# Patient Record
Sex: Male | Born: 1955
Health system: Southern US, Community
[De-identification: ages and names within clinical notes are randomized; demographics above are authoritative.]

## PROBLEM LIST (undated history)

## (undated) DIAGNOSIS — I10 Essential (primary) hypertension: Secondary | ICD-10-CM

## (undated) DIAGNOSIS — M1990 Unspecified osteoarthritis, unspecified site: Secondary | ICD-10-CM

## (undated) DIAGNOSIS — C801 Malignant (primary) neoplasm, unspecified: Secondary | ICD-10-CM

## (undated) DIAGNOSIS — S82209A Unspecified fracture of shaft of unspecified tibia, initial encounter for closed fracture: Secondary | ICD-10-CM

## (undated) DIAGNOSIS — N529 Male erectile dysfunction, unspecified: Secondary | ICD-10-CM

## (undated) DIAGNOSIS — S2220XA Unspecified fracture of sternum, initial encounter for closed fracture: Secondary | ICD-10-CM

## (undated) DIAGNOSIS — Z972 Presence of dental prosthetic device (complete) (partial): Secondary | ICD-10-CM

## (undated) DIAGNOSIS — C4491 Basal cell carcinoma of skin, unspecified: Secondary | ICD-10-CM

## (undated) HISTORY — PX: PROSTATECTOMY: SHX69

## (undated) HISTORY — DX: Male erectile dysfunction, unspecified: N52.9

## (undated) HISTORY — DX: Basal cell carcinoma of skin, unspecified: C44.91

---

## 2006-11-05 ENCOUNTER — Emergency Department: Payer: Self-pay | Admitting: Emergency Medicine

## 2008-09-21 ENCOUNTER — Ambulatory Visit: Payer: Self-pay | Admitting: Gastroenterology

## 2011-06-12 LAB — HM COLONOSCOPY: HM Colonoscopy: NORMAL

## 2012-04-10 DIAGNOSIS — R399 Unspecified symptoms and signs involving the genitourinary system: Secondary | ICD-10-CM | POA: Insufficient documentation

## 2012-04-10 DIAGNOSIS — Z8546 Personal history of malignant neoplasm of prostate: Secondary | ICD-10-CM | POA: Insufficient documentation

## 2012-04-10 DIAGNOSIS — N529 Male erectile dysfunction, unspecified: Secondary | ICD-10-CM | POA: Insufficient documentation

## 2012-04-10 DIAGNOSIS — C61 Malignant neoplasm of prostate: Secondary | ICD-10-CM | POA: Insufficient documentation

## 2014-01-09 ENCOUNTER — Emergency Department: Payer: Self-pay | Admitting: Emergency Medicine

## 2014-01-09 LAB — CBC
HCT: 40.5 % (ref 40.0–52.0)
HGB: 13.1 g/dL (ref 13.0–18.0)
MCH: 31.1 pg (ref 26.0–34.0)
MCHC: 32.3 g/dL (ref 32.0–36.0)
MCV: 96 fL (ref 80–100)
Platelet: 327 10*3/uL (ref 150–440)
RBC: 4.21 10*6/uL — ABNORMAL LOW (ref 4.40–5.90)
RDW: 14.5 % (ref 11.5–14.5)
WBC: 8.1 10*3/uL (ref 3.8–10.6)

## 2014-01-09 LAB — COMPREHENSIVE METABOLIC PANEL
ALBUMIN: 3.8 g/dL (ref 3.4–5.0)
Alkaline Phosphatase: 38 U/L — ABNORMAL LOW
Anion Gap: 4 — ABNORMAL LOW (ref 7–16)
BUN: 14 mg/dL (ref 7–18)
Bilirubin,Total: 0.6 mg/dL (ref 0.2–1.0)
CHLORIDE: 108 mmol/L — AB (ref 98–107)
CO2: 28 mmol/L (ref 21–32)
Calcium, Total: 8.9 mg/dL (ref 8.5–10.1)
Creatinine: 1.15 mg/dL (ref 0.60–1.30)
Glucose: 87 mg/dL (ref 65–99)
Osmolality: 279 (ref 275–301)
POTASSIUM: 4.1 mmol/L (ref 3.5–5.1)
SGOT(AST): 28 U/L (ref 15–37)
SGPT (ALT): 25 U/L
Sodium: 140 mmol/L (ref 136–145)
Total Protein: 7.9 g/dL (ref 6.4–8.2)

## 2014-01-09 LAB — TROPONIN I

## 2014-01-12 DIAGNOSIS — R079 Chest pain, unspecified: Secondary | ICD-10-CM | POA: Insufficient documentation

## 2015-07-25 ENCOUNTER — Emergency Department: Payer: BLUE CROSS/BLUE SHIELD

## 2015-07-25 ENCOUNTER — Emergency Department
Admission: EM | Admit: 2015-07-25 | Discharge: 2015-07-25 | Disposition: A | Discharge status: Home / Self Care | Payer: BLUE CROSS/BLUE SHIELD | Attending: Emergency Medicine | Admitting: Emergency Medicine

## 2015-07-25 ENCOUNTER — Encounter: Payer: Self-pay | Admitting: Emergency Medicine

## 2015-07-25 DIAGNOSIS — M549 Dorsalgia, unspecified: Secondary | ICD-10-CM | POA: Insufficient documentation

## 2015-07-25 DIAGNOSIS — R109 Unspecified abdominal pain: Secondary | ICD-10-CM | POA: Diagnosis not present

## 2015-07-25 DIAGNOSIS — R079 Chest pain, unspecified: Secondary | ICD-10-CM | POA: Insufficient documentation

## 2015-07-25 LAB — URINALYSIS COMPLETE WITH MICROSCOPIC (ARMC ONLY)
BACTERIA UA: NONE SEEN
Bilirubin Urine: NEGATIVE
GLUCOSE, UA: NEGATIVE mg/dL
KETONES UR: NEGATIVE mg/dL
LEUKOCYTES UA: NEGATIVE
NITRITE: NEGATIVE
Protein, ur: NEGATIVE mg/dL
SPECIFIC GRAVITY, URINE: 1.02 (ref 1.005–1.030)
Squamous Epithelial / LPF: NONE SEEN
pH: 5 (ref 5.0–8.0)

## 2015-07-25 LAB — COMPREHENSIVE METABOLIC PANEL
ALT: 15 U/L — ABNORMAL LOW (ref 17–63)
ANION GAP: 5 (ref 5–15)
AST: 17 U/L (ref 15–41)
Albumin: 4.4 g/dL (ref 3.5–5.0)
Alkaline Phosphatase: 42 U/L (ref 38–126)
BUN: 15 mg/dL (ref 6–20)
CALCIUM: 9.5 mg/dL (ref 8.9–10.3)
CHLORIDE: 105 mmol/L (ref 101–111)
CO2: 29 mmol/L (ref 22–32)
Creatinine, Ser: 1.05 mg/dL (ref 0.61–1.24)
Glucose, Bld: 75 mg/dL (ref 65–99)
Potassium: 4.5 mmol/L (ref 3.5–5.1)
Sodium: 139 mmol/L (ref 135–145)
Total Bilirubin: 1 mg/dL (ref 0.3–1.2)
Total Protein: 7.9 g/dL (ref 6.5–8.1)

## 2015-07-25 LAB — CBC WITH DIFFERENTIAL/PLATELET
BASOS ABS: 0.1 10*3/uL (ref 0–0.1)
BASOS PCT: 1 %
EOS ABS: 0.1 10*3/uL (ref 0–0.7)
Eosinophils Relative: 2 %
HCT: 42 % (ref 40.0–52.0)
Hemoglobin: 13.9 g/dL (ref 13.0–18.0)
Lymphocytes Relative: 40 %
Lymphs Abs: 2.5 10*3/uL (ref 1.0–3.6)
MCH: 31.1 pg (ref 26.0–34.0)
MCHC: 33.1 g/dL (ref 32.0–36.0)
MCV: 93.8 fL (ref 80.0–100.0)
MONO ABS: 0.7 10*3/uL (ref 0.2–1.0)
Monocytes Relative: 11 %
NEUTROS ABS: 3 10*3/uL (ref 1.4–6.5)
Neutrophils Relative %: 46 %
PLATELETS: 342 10*3/uL (ref 150–440)
RBC: 4.48 MIL/uL (ref 4.40–5.90)
RDW: 14.7 % — ABNORMAL HIGH (ref 11.5–14.5)
WBC: 6.4 10*3/uL (ref 3.8–10.6)

## 2015-07-25 LAB — LIPASE, BLOOD: LIPASE: 21 U/L (ref 11–51)

## 2015-07-25 LAB — FIBRIN DERIVATIVES D-DIMER (ARMC ONLY): FIBRIN DERIVATIVES D-DIMER (ARMC): 682 — AB (ref 0–499)

## 2015-07-25 LAB — TROPONIN I

## 2015-07-25 MED ORDER — IOHEXOL 350 MG/ML SOLN
100.0000 mL | Freq: Once | INTRAVENOUS | Status: AC | PRN
Start: 2015-07-25 — End: 2015-07-25
  Administered 2015-07-25: 100 mL via INTRAVENOUS

## 2015-07-25 MED ORDER — IOHEXOL 240 MG/ML SOLN
25.0000 mL | Freq: Once | INTRAMUSCULAR | Status: AC | PRN
  Administered 2015-07-25: 25 mL via ORAL

## 2015-07-25 MED ORDER — MORPHINE SULFATE (PF) 4 MG/ML IV SOLN
4.0000 mg | Freq: Once | INTRAVENOUS | Status: AC
  Administered 2015-07-25: 4 mg via INTRAVENOUS
  Filled 2015-07-25: qty 1

## 2015-07-25 MED ORDER — IOHEXOL 240 MG/ML SOLN: 25.0000 mL | Freq: Once | INTRAMUSCULAR | Status: DC | PRN

## 2015-07-25 MED ORDER — ONDANSETRON HCL 4 MG/2ML IJ SOLN
4.0000 mg | Freq: Once | INTRAMUSCULAR | Status: AC
  Administered 2015-07-25: 4 mg via INTRAVENOUS
  Filled 2015-07-25: qty 2

## 2015-07-25 NOTE — ED Notes (Signed)
Brought over by [facility] clinic with flank pain. NAD

## 2015-07-25 NOTE — Discharge Instructions (Signed)
You have been seen in the Emergency Department (ED)  today for back pain.  Your workup and exam have not shown any acute abnormalities and you are likely suffering from muscle strain or possible   Please follow up with your doctor (he may wish to use the [place] walk-in clinic if he do not have a primary doctor) as soon as possible regarding today's ED visit and your back pain.  Return to the ED for worsening back pain, fever, weakness or numbness of either leg, or if you develop either (1) an inability to urinate or have bowel movements, or (2) loss of your ability to control your bathroom functions (if you start having "accidents"), or if you develop other new symptoms that concern you.  Please also make sure you set up follow-up with a primary care doctor for reevaluation of a small "spots" or "nodules" seen on CT scan. As you are a smoker you are at risk for lung cancer, and these need follow-up within the next 6 months.   Back Pain, Adult Back pain is very common in adults.The cause of back pain is rarely dangerous and the pain often gets better over time.The cause of your back pain may not be known. Some common causes of back pain include:  Strain of the muscles or ligaments supporting the spine.  Wear and tear (degeneration) of the spinal disks.  Arthritis.  Direct injury to the back. For many people, back pain may return. Since back pain is rarely dangerous, most people can learn to manage this condition on their own. HOME CARE INSTRUCTIONS Watch your back pain for any changes. The following actions may help to lessen any discomfort you are feeling:  Remain active. It is stressful on your back to sit or stand in one place for long periods of time. Do not sit, drive, or stand in one place for more than 30 minutes at a time. Take short walks on even surfaces as soon as you are able.Try to increase the length of time you walk each day.  Exercise regularly as directed by your health  care provider. Exercise helps your back heal faster. It also helps avoid future injury by keeping your muscles strong and flexible.  Do not stay in bed.Resting more than 1-2 days can delay your recovery.  Pay attention to your body when you bend and lift. The most comfortable positions are those that put less stress on your recovering back. Always use proper lifting techniques, including:  Bending your knees.  Keeping the load close to your body.  Avoiding twisting.  Find a comfortable position to sleep. Use a firm mattress and lie on your side with your knees slightly bent. If you lie on your back, put a pillow under your knees.  Avoid feeling anxious or stressed.Stress increases muscle tension and can worsen back pain.It is important to recognize when you are anxious or stressed and learn ways to manage it, such as with exercise.  Take medicines only as directed by your health care provider. Over-the-counter medicines to reduce pain and inflammation are often the most helpful.Your health care provider may prescribe muscle relaxant drugs.These medicines help dull your pain so you can more quickly return to your normal activities and healthy exercise.  Apply ice to the injured area:  Put ice in a plastic bag.  Place a towel between your skin and the bag.  Leave the ice on for 20 minutes, 2-3 times a day for the first 2-3 days. After that,  ice and heat may be alternated to reduce pain and spasms.  Maintain a healthy weight. Excess weight puts extra stress on your back and makes it difficult to maintain good posture. SEEK MEDICAL CARE IF:  You have pain that is not relieved with rest or medicine.  You have increasing pain going down into the legs or buttocks.  You have pain that does not improve in one week.  You have night pain.  You lose weight.  You have a fever or chills. SEEK IMMEDIATE MEDICAL CARE IF:   You develop new bowel or bladder control problems.  You have  unusual weakness or numbness in your arms or legs.  You develop nausea or vomiting.  You develop abdominal pain.  You feel faint.   This information is not intended to replace advice given to you by your health care provider. Make sure you discuss any questions you have with your health care provider.   Document Released: 05/28/2005 Document Revised: 06/18/2014 Document Reviewed: 09/29/2013 Elsevier Interactive Patient Education [employer].

## 2015-07-25 NOTE — ED Provider Notes (Signed)
[facility] Emergency Department Provider Note  ____________________________________________  Time seen: Approximately 12:54 PM  I have reviewed the triage vital signs and the nursing notes.   HISTORY  Chief Complaint Flank Pain    HPI Justin Mckee is a 60 y.o. male reports no past medical history.  Patient reports that for the last week and a half he's been having pain that is a "catching" and severe pain whenever he moves twists or takes a deep breath located over the right mid back and flank versus right lower chest. Not associated nausea vomiting fevers chills, and he denies "abdominal pain".  Denies any medication use other than taking a couple ibuprofen yesterday but did not make much improvement.  History reviewed. No pertinent past medical history.  There are no active problems to display for this patient.   Past Surgical History  Procedure Laterality Date  . Prostatectomy      No current outpatient prescriptions on file.  Allergies Review of patient's allergies indicates no known allergies.  History reviewed. No pertinent family history.  Social History Social History  Substance Use Topics  . Smoking status: Never Smoker   . Smokeless tobacco: None  . Alcohol Use: None    Review of Systems Constitutional: No fever/chills Eyes: No visual changes. ENT: No sore throat. Cardiovascular: Denies chest pain. Respiratory: Denies shortness of breath. Gastrointestinal: No abdominal pain describes pain along the right mid flank and right lower posterior back.  No nausea, no vomiting.  No diarrhea.  No constipation. Genitourinary: Negative for dysuria. Musculoskeletal: Negative for back pain. Skin: Negative for rash. Neurological: Negative for headaches, focal weakness or numbness.  10-point ROS otherwise negative.  Denies recent hospitalizations, history of blood clots, leg swelling. The patient is a smoker. No long trips or travel. No  cough or hemoptysis. ____________________________________________   PHYSICAL EXAM:  VITAL SIGNS: ED Triage Vitals  Enc Vitals Group     BP 07/25/15 1206 137/98 mmHg     Pulse Rate 07/25/15 1206 78     Resp 07/25/15 1206 20     Temp 07/25/15 1206 98 F (36.7 C)     Temp Source 07/25/15 1206 Oral     SpO2 07/25/15 1206 99 %     Weight 07/25/15 1206 160 lb (72.576 kg)     Height 07/25/15 1206 5\' 9"  (1.753 m)     Head Cir --      Peak Flow --      Pain Score --      Pain Loc --      Pain Edu? --      Excl. in [place]? --    Constitutional: Alert and oriented. Well appearing and in no acute distress. Eyes: Conjunctivae are normal. PERRL. EOMI. Head: Atraumatic. Nose: No congestion/rhinnorhea. Mouth/Throat: Mucous membranes are moist.  Oropharynx non-erythematous. Neck: No stridor.  No cervical tenderness Cardiovascular: Normal rate, regular rhythm. Grossly normal heart sounds.  Good peripheral circulation. Patient does have notable tenderness along the right parathoracic paraspinous muscles. Respiratory: Normal respiratory effort.  No retractions. Lungs CTAB. Patient does splint with deep inspiration states pain is worsened when he takes a deep breath. Gastrointestinal: Soft and nontender except for pain with deep inspiration and moderate tenderness to the right upper quadrant without rebound or guarding. No distention. No abdominal bruits. Moderate right-sided CVA tenderness, none on left. Musculoskeletal: No lower extremity tenderness nor edema.  No joint effusions. Neurologic:  Normal speech and language. No gross focal neurologic deficits are appreciated.  No gait instability. Skin:  Skin is warm, dry and intact. No rash noted. Psychiatric: Mood and affect are normal. Speech and behavior are normal.  ____________________________________________   LABS (all labs ordered are listed, but only abnormal results are displayed)  Labs Reviewed  CBC WITH DIFFERENTIAL/PLATELET - Abnormal;  Notable for the following:    RDW 14.7 (*)    All other components within normal limits  COMPREHENSIVE METABOLIC PANEL - Abnormal; Notable for the following:    ALT 15 (*)    All other components within normal limits  URINALYSIS COMPLETEWITH MICROSCOPIC ([facility] ONLY) - Abnormal; Notable for the following:    Color, Urine YELLOW (*)    APPearance CLEAR (*)    Hgb urine dipstick 1+ (*)    All other components within normal limits  FIBRIN DERIVATIVES D-DIMER ([facility] ONLY) - Abnormal; Notable for the following:    Fibrin derivatives D-dimer ([facility]) 682 (*)    All other components within normal limits  LIPASE, BLOOD  TROPONIN I   ____________________________________________  EKG  Reviewed and interpreted me at 1310 Normal sinus rhythm Heart rate 60 Probable left ventricular hypertrophy No acute ischemic T-wave abnormality QTc 4:30 QRS 90 reviewed as left ventricular hypertrophy without acute ischemic abnormality noted ____________________________________________  RADIOLOGY  IMPRESSION: 1. A specific cause to explain the patient's right abdominal and back pain is not identified. 2. No filling defect is identified in the pulmonary arterial tree to suggest pulmonary embolus. 3. Mild cardiomegaly. 4. Mild wall thickening in the proximal half of the esophagus-esophagitis not excluded. 5. Paraseptal emphysema. 6. 5 mm right upper lobe nodule and 4 mm right middle lobe nodule. If the patient is at high risk for bronchogenic carcinoma, follow-up chest CT at 6-12 months is recommended. If the patient is at low risk for bronchogenic carcinoma, follow-up chest CT at 12 months is recommended. This recommendation follows the consensus statement: Guidelines for Management of Small Pulmonary Nodules Detected on CT Scans: A Statement from the [place] as published in Radiology 2005;237:395-400. 7. Borderline enlarged right infrahilar lymph node. 8. Mild abdominal aortic  atherosclerotic calcification. 9. Prior prostatectomy. ____________________________________________   PROCEDURES  Procedure(s) performed: None  Critical Care performed: No  ____________________________________________   INITIAL IMPRESSION / ASSESSMENT AND PLAN / ED COURSE  Pertinent labs & imaging results that were available during my care of the patient were reviewed by me and considered in my medical decision making (see chart for details).  Patient presents with right sided posterior midthoracic to lower thoracic back pain. Slightly unclear etiology of pain is referred from paraspinous muscles, posterior chest, or right upper quadrant. He is tender and has right-sided CVA tenderness and right upper quadrant pain. LFTs are notably normal. Lipase wnl.  Patient has no frank evidence of pulmonary embolism, dissection, an EKG without ischemic change noted and very atypical symptoms not suspicious for acute coronary syndrome at this time.  Procedure the chest x-ray, the patient is low risk but does demonstrate pleuritic component of pain that have screen via d-dimer.   ----------------------------------------- 4:21 PM on 07/25/2015 -----------------------------------------  Patient reports pain much better. Resting comfortably. No distress. CT scan reassuring. Await troponin and lipase. If both negative anticipate discharge to home, likely musculoskeletal in nature without evidence of acute dissection, pulmonary embolism, acute coronary syndrome pneumothorax or other immediate life-threatening identified. Patient appears to be stable for discharge.  Return precautions and treatment recommendations and follow-up discussed with the patient who is agreeable with the plan.  ____________________________________________  FINAL CLINICAL IMPRESSION(S) / ED DIAGNOSES  Final diagnoses:  Back pain      Delman Kitten, MD 07/25/15 2229

## 2015-07-25 NOTE — ED Notes (Signed)
Patient transported to CT 

## 2015-08-28 ENCOUNTER — Encounter: Payer: Self-pay | Admitting: *Deleted

## 2015-08-28 ENCOUNTER — Emergency Department: Payer: BLUE CROSS/BLUE SHIELD

## 2015-08-28 ENCOUNTER — Emergency Department
Admission: EM | Admit: 2015-08-28 | Discharge: 2015-08-28 | Disposition: A | Discharge status: Other Acute Inpatient Hospital | Payer: BLUE CROSS/BLUE SHIELD | Attending: Emergency Medicine | Admitting: Emergency Medicine

## 2015-08-28 DIAGNOSIS — Y998 Other external cause status: Secondary | ICD-10-CM | POA: Diagnosis not present

## 2015-08-28 DIAGNOSIS — S2222XA Fracture of body of sternum, initial encounter for closed fracture: Secondary | ICD-10-CM | POA: Diagnosis not present

## 2015-08-28 DIAGNOSIS — Y92410 Unspecified street and highway as the place of occurrence of the external cause: Secondary | ICD-10-CM | POA: Diagnosis not present

## 2015-08-28 DIAGNOSIS — Y9389 Activity, other specified: Secondary | ICD-10-CM | POA: Diagnosis not present

## 2015-08-28 DIAGNOSIS — S82251A Displaced comminuted fracture of shaft of right tibia, initial encounter for closed fracture: Secondary | ICD-10-CM | POA: Diagnosis not present

## 2015-08-28 DIAGNOSIS — R55 Syncope and collapse: Secondary | ICD-10-CM | POA: Insufficient documentation

## 2015-08-28 DIAGNOSIS — F17210 Nicotine dependence, cigarettes, uncomplicated: Secondary | ICD-10-CM | POA: Diagnosis not present

## 2015-08-28 DIAGNOSIS — S2220XA Unspecified fracture of sternum, initial encounter for closed fracture: Secondary | ICD-10-CM

## 2015-08-28 DIAGNOSIS — S82209A Unspecified fracture of shaft of unspecified tibia, initial encounter for closed fracture: Secondary | ICD-10-CM

## 2015-08-28 DIAGNOSIS — F1099 Alcohol use, unspecified with unspecified alcohol-induced disorder: Secondary | ICD-10-CM | POA: Diagnosis not present

## 2015-08-28 DIAGNOSIS — Z8546 Personal history of malignant neoplasm of prostate: Secondary | ICD-10-CM | POA: Insufficient documentation

## 2015-08-28 DIAGNOSIS — S82201A Unspecified fracture of shaft of right tibia, initial encounter for closed fracture: Secondary | ICD-10-CM

## 2015-08-28 HISTORY — DX: Unspecified fracture of sternum, initial encounter for closed fracture: S22.20XA

## 2015-08-28 HISTORY — DX: Malignant (primary) neoplasm, unspecified: C80.1

## 2015-08-28 HISTORY — DX: Unspecified fracture of shaft of unspecified tibia, initial encounter for closed fracture: S82.209A

## 2015-08-28 LAB — URINE DRUG SCREEN, QUALITATIVE (ARMC ONLY)
Amphetamines, Ur Screen: NOT DETECTED
BARBITURATES, UR SCREEN: NOT DETECTED
Benzodiazepine, Ur Scrn: NOT DETECTED
CANNABINOID 50 NG, UR ~~LOC~~: NOT DETECTED
COCAINE METABOLITE, UR ~~LOC~~: NOT DETECTED
MDMA (Ecstasy)Ur Screen: NOT DETECTED
Methadone Scn, Ur: NOT DETECTED
OPIATE, UR SCREEN: NOT DETECTED
Phencyclidine (PCP) Ur S: NOT DETECTED
Tricyclic, Ur Screen: NOT DETECTED

## 2015-08-28 LAB — HEPATIC FUNCTION PANEL
ALK PHOS: 42 U/L (ref 38–126)
ALT: 28 U/L (ref 17–63)
AST: 45 U/L — ABNORMAL HIGH (ref 15–41)
Albumin: 4.1 g/dL (ref 3.5–5.0)
BILIRUBIN DIRECT: 0.2 mg/dL (ref 0.1–0.5)
BILIRUBIN INDIRECT: 0.4 mg/dL (ref 0.3–0.9)
BILIRUBIN TOTAL: 0.6 mg/dL (ref 0.3–1.2)
Total Protein: 7.6 g/dL (ref 6.5–8.1)

## 2015-08-28 LAB — BASIC METABOLIC PANEL
Anion gap: 8 (ref 5–15)
BUN: 10 mg/dL (ref 6–20)
CALCIUM: 8.8 mg/dL — AB (ref 8.9–10.3)
CHLORIDE: 103 mmol/L (ref 101–111)
CO2: 24 mmol/L (ref 22–32)
CREATININE: 1.32 mg/dL — AB (ref 0.61–1.24)
GFR calc non Af Amer: 57 mL/min — ABNORMAL LOW (ref >60)
Glucose, Bld: 189 mg/dL — ABNORMAL HIGH (ref 65–99)
Potassium: 3.1 mmol/L — ABNORMAL LOW (ref 3.5–5.1)
SODIUM: 135 mmol/L (ref 135–145)

## 2015-08-28 LAB — CBC
HCT: 38.3 % — ABNORMAL LOW (ref 40.0–52.0)
Hemoglobin: 13.4 g/dL (ref 13.0–18.0)
MCH: 31.9 pg (ref 26.0–34.0)
MCHC: 34.9 g/dL (ref 32.0–36.0)
MCV: 91.6 fL (ref 80.0–100.0)
PLATELETS: 325 10*3/uL (ref 150–440)
RBC: 4.18 MIL/uL — AB (ref 4.40–5.90)
RDW: 14.5 % (ref 11.5–14.5)
WBC: 8.2 10*3/uL (ref 3.8–10.6)

## 2015-08-28 LAB — URINALYSIS COMPLETE WITH MICROSCOPIC (ARMC ONLY)
BILIRUBIN URINE: NEGATIVE
Bacteria, UA: NONE SEEN
GLUCOSE, UA: 50 mg/dL — AB
Ketones, ur: NEGATIVE mg/dL
LEUKOCYTES UA: NEGATIVE
NITRITE: NEGATIVE
Protein, ur: 30 mg/dL — AB
SPECIFIC GRAVITY, URINE: 1.012 (ref 1.005–1.030)
Squamous Epithelial / LPF: NONE SEEN
pH: 6 (ref 5.0–8.0)

## 2015-08-28 LAB — TROPONIN I: Troponin I: <0.03 ng/mL (ref <0.031)

## 2015-08-28 LAB — LIPASE, BLOOD: LIPASE: 23 U/L (ref 11–51)

## 2015-08-28 LAB — ETHANOL: Alcohol, Ethyl (B): 61 mg/dL — ABNORMAL HIGH (ref <5)

## 2015-08-28 MED ORDER — FENTANYL CITRATE (PF) 100 MCG/2ML IJ SOLN
50.0000 ug | Freq: Once | INTRAMUSCULAR | Status: AC
  Administered 2015-08-28: 50 ug via INTRAVENOUS
  Filled 2015-08-28: qty 2

## 2015-08-28 MED ORDER — IOHEXOL 300 MG/ML  SOLN
100.0000 mL | Freq: Once | INTRAMUSCULAR | Status: AC | PRN
  Administered 2015-08-28: 100 mL via INTRAVENOUS

## 2015-08-28 MED ORDER — SODIUM CHLORIDE 0.9 % IV BOLUS (SEPSIS)
500.0000 mL | Freq: Once | INTRAVENOUS | Status: AC
  Administered 2015-08-28: 500 mL via INTRAVENOUS

## 2015-08-28 NOTE — ED Notes (Signed)
Pt triaged as high fall risk due to presenting symptoms of syncope

## 2015-08-28 NOTE — ED Notes (Signed)
Verbal report given to [facility]

## 2015-08-28 NOTE — ED Notes (Signed)
At pt's bedside documenting after he returned from radiology; pt has nodded off to sleep after c/o right leg and chest pain; resp even and nonlabored; VSS;

## 2015-08-28 NOTE — ED Notes (Signed)
Pt back from CT and x ray
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
## 2015-08-29 DIAGNOSIS — F17200 Nicotine dependence, unspecified, uncomplicated: Secondary | ICD-10-CM | POA: Insufficient documentation

## 2015-08-29 DIAGNOSIS — Z8781 Personal history of (healed) traumatic fracture: Secondary | ICD-10-CM | POA: Insufficient documentation

## 2015-08-29 DIAGNOSIS — S2222XA Fracture of body of sternum, initial encounter for closed fracture: Secondary | ICD-10-CM | POA: Insufficient documentation

## 2015-09-18 ENCOUNTER — Encounter: Payer: Self-pay | Admitting: Emergency Medicine

## 2015-09-18 ENCOUNTER — Emergency Department
Admission: EM | Admit: 2015-09-18 | Discharge: 2015-09-18 | Disposition: A | Discharge status: Home / Self Care | Payer: BLUE CROSS/BLUE SHIELD | Attending: Emergency Medicine | Admitting: Emergency Medicine

## 2015-09-18 ENCOUNTER — Emergency Department: Payer: BLUE CROSS/BLUE SHIELD

## 2015-09-18 DIAGNOSIS — R079 Chest pain, unspecified: Secondary | ICD-10-CM | POA: Diagnosis present

## 2015-09-18 DIAGNOSIS — J189 Pneumonia, unspecified organism: Secondary | ICD-10-CM | POA: Insufficient documentation

## 2015-09-18 DIAGNOSIS — Z8546 Personal history of malignant neoplasm of prostate: Secondary | ICD-10-CM | POA: Diagnosis not present

## 2015-09-18 DIAGNOSIS — F17210 Nicotine dependence, cigarettes, uncomplicated: Secondary | ICD-10-CM | POA: Insufficient documentation

## 2015-09-18 LAB — CBC
HEMATOCRIT: 36.5 % — AB (ref 40.0–52.0)
HEMOGLOBIN: 12.3 g/dL — AB (ref 13.0–18.0)
MCH: 30.7 pg (ref 26.0–34.0)
MCHC: 33.7 g/dL (ref 32.0–36.0)
MCV: 91.1 fL (ref 80.0–100.0)
Platelets: 351 10*3/uL (ref 150–440)
RBC: 4.01 MIL/uL — AB (ref 4.40–5.90)
RDW: 13.6 % (ref 11.5–14.5)
WBC: 11.5 10*3/uL — ABNORMAL HIGH (ref 3.8–10.6)

## 2015-09-18 LAB — BASIC METABOLIC PANEL
ANION GAP: 8 (ref 5–15)
BUN: 13 mg/dL (ref 6–20)
CHLORIDE: 96 mmol/L — AB (ref 101–111)
CO2: 27 mmol/L (ref 22–32)
CREATININE: 0.95 mg/dL (ref 0.61–1.24)
Calcium: 9.6 mg/dL (ref 8.9–10.3)
GFR calc non Af Amer: >60 mL/min (ref >60)
Glucose, Bld: 105 mg/dL — ABNORMAL HIGH (ref 65–99)
POTASSIUM: 3.9 mmol/L (ref 3.5–5.1)
Sodium: 131 mmol/L — ABNORMAL LOW (ref 135–145)

## 2015-09-18 LAB — TROPONIN I: Troponin I: <0.03 ng/mL (ref <0.031)

## 2015-09-18 MED ORDER — LEVOFLOXACIN 750 MG PO TABS: 750.0000 mg | ORAL_TABLET | Freq: Every day | ORAL | Status: AC

## 2015-09-18 MED ORDER — LEVOFLOXACIN 750 MG PO TABS
750.0000 mg | ORAL_TABLET | Freq: Every day | ORAL | Status: DC
  Administered 2015-09-18: 750 mg via ORAL
  Filled 2015-09-18: qty 1

## 2015-09-18 NOTE — ED Notes (Signed)
Pt states at approx 1300 he began having chest pain that radiates to his back. Pt denies cardiac hx, states he has been seen before for chest pain.
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
## 2015-09-18 NOTE — Discharge Instructions (Signed)
Please seek medical attention for any high fevers, chest pain, shortness of breath, change in behavior, persistent vomiting, bloody stool or any other new or concerning symptoms.   Community-Acquired Pneumonia, Adult Pneumonia is an infection of the lungs. One type of pneumonia can happen while a person is in a hospital. A different type can happen when a person is not in a hospital (community-acquired pneumonia). It is easy for this kind to spread from person to person. It can spread to you if you breathe near an infected person who coughs or sneezes. Some symptoms include:  A dry cough.  A wet (productive) cough.  Fever.  Sweating.  Chest pain. HOME CARE  Take over-the-counter and prescription medicines only as told by your doctor.  Only take cough medicine if you are losing sleep.  If you were prescribed an antibiotic medicine, take it as told by your doctor. Do not stop taking the antibiotic even if you start to feel better.  Sleep with your head and neck raised (elevated). You can do this by putting a few pillows under your head, or you can sleep in a recliner.  Do not use tobacco products. These include cigarettes, chewing tobacco, and e-cigarettes. If you need help quitting, ask your doctor.  Drink enough water to keep your pee (urine) clear or pale yellow. A shot (vaccine) can help prevent pneumonia. Shots are often suggested for: 1. People older than 60 years of age. 2. People older than 60 years of age: 15. Who are having cancer treatment. 2. Who have long-term (chronic) lung disease. 3. Who have problems with their body's defense system (immune system). You may also prevent pneumonia if you take these actions:  Get the flu (influenza) shot every year.  Go to the dentist as often as told.  Wash your hands often. If soap and water are not available, use hand sanitizer. GET HELP IF:  You have a fever.  You lose sleep because your cough medicine does not help. GET  HELP RIGHT AWAY IF:  You are short of breath and it gets worse.  You have more chest pain.  Your sickness gets worse. This is very serious if:  You are an older adult.  Your body's defense system is weak.  You cough up blood.   This information is not intended to replace advice given to you by your health care provider. Make sure you discuss any questions you have with your health care provider.   Document Released: 11/14/2007 Document Revised: 02/16/2015 Document Reviewed: 09/22/2014 Elsevier Interactive Patient Education 2016 [place] An incentive spirometer is a tool that can help keep your lungs clear and active. This tool measures how well you are filling your lungs with each breath. Taking long, deep breaths may help reverse or decrease the chance of developing breathing (pulmonary) problems (especially infection) following:  Surgery of the chest or abdomen.  Surgery if you have a history of smoking or a lung problem.  A long period of time when you are unable to move or be active. BEFORE THE PROCEDURE   If the spirometer includes an indicator to show your best effort, your nurse or respiratory therapist will set it to a desired goal.  If possible, sit up straight or lean slightly forward. Try not to slouch.  Hold the incentive spirometer in an upright position. INSTRUCTIONS FOR USE  3. Sit on the edge of your bed if possible, or sit up as far as you can in bed or  on a chair. 4. Hold the incentive spirometer in an upright position. 5. Breathe out normally. 6. Place the mouthpiece in your mouth and seal your lips tightly around it. 7. Breathe in slowly and as deeply as possible, raising the piston or the ball toward the top of the column. 8. Hold your breath for 3-5 seconds or for as long as possible. Allow the piston or ball to fall to the bottom of the column. 9. Remove the mouthpiece from your mouth and breathe out normally. 10. Rest for a  few seconds and repeat Steps 1 through 7 at least 10 times every 1-2 hours when you are awake. Take your time and take a few normal breaths between deep breaths. 11. The spirometer may include an indicator to show your best effort. Use the indicator as a goal to work toward during each repetition. 12. After each set of 10 deep breaths, practice coughing to be sure your lungs are clear. If you have an incision (the cut made at the time of surgery), support your incision when coughing by placing a pillow or rolled-up towels firmly against it. Once you are able to get out of bed, walk around indoors and cough well. You may stop using the incentive spirometer when instructed by your caregiver.  RISKS AND COMPLICATIONS  Breathing too quickly may cause dizziness. At an extreme, this could cause you to pass out. Take your time so you do not get dizzy or light-headed.  If you are in pain, you may need to take or ask for pain medication before doing incentive spirometry. It is harder to take a deep breath if you are having pain. AFTER USE  Rest and breathe slowly and easily.  It can be helpful to keep a log of your progress. Your caregiver can provide you with a simple table to help with this. If you are using the spirometer at home, follow these instructions: [place] IF:   You are having difficultly using the spirometer.  You have trouble using the spirometer as often as instructed.  Your pain medication is not giving enough relief while using the spirometer.  You develop fever of 100.33F (38.1C) or higher. SEEK IMMEDIATE MEDICAL CARE IF:   You cough up bloody sputum that had not been present before.  You develop fever of 102F (38.9C) or greater.  You develop worsening pain at or near the incision site. MAKE SURE YOU:   Understand these instructions.  Will watch your condition.  Will get help right away if you are not doing well or get worse.   This information is not  intended to replace advice given to you by your health care provider. Make sure you discuss any questions you have with your health care provider.   Document Released: 10/08/2006 Document Revised: 06/18/2014 Document Reviewed: 01/04/2014 Elsevier Interactive Patient Education [employer].

## 2015-10-14 ENCOUNTER — Ambulatory Visit
Admission: RE | Admit: 2015-10-14 | Discharge: 2015-10-14 | Disposition: A | Discharge status: Home / Self Care | Payer: BLUE CROSS/BLUE SHIELD | Source: Ambulatory Visit | Attending: Family Medicine | Admitting: Family Medicine

## 2015-10-14 ENCOUNTER — Ambulatory Visit (INDEPENDENT_AMBULATORY_CARE_PROVIDER_SITE_OTHER): Payer: BLUE CROSS/BLUE SHIELD | Admitting: Family Medicine

## 2015-10-14 ENCOUNTER — Encounter: Payer: Self-pay | Admitting: Family Medicine

## 2015-10-14 ENCOUNTER — Other Ambulatory Visit: Payer: Self-pay | Admitting: *Deleted

## 2015-10-14 VITALS — BP 132/88 | HR 71 | Temp 98.1°F | Resp 16 | Ht 70.0 in | Wt 167.0 lb

## 2015-10-14 DIAGNOSIS — N529 Male erectile dysfunction, unspecified: Secondary | ICD-10-CM

## 2015-10-14 DIAGNOSIS — Z720 Tobacco use: Secondary | ICD-10-CM | POA: Diagnosis not present

## 2015-10-14 DIAGNOSIS — J189 Pneumonia, unspecified organism: Secondary | ICD-10-CM | POA: Diagnosis present

## 2015-10-14 DIAGNOSIS — E785 Hyperlipidemia, unspecified: Secondary | ICD-10-CM | POA: Diagnosis not present

## 2015-10-14 DIAGNOSIS — S2220XD Unspecified fracture of sternum, subsequent encounter for fracture with routine healing: Secondary | ICD-10-CM

## 2015-10-14 DIAGNOSIS — N528 Other male erectile dysfunction: Secondary | ICD-10-CM | POA: Diagnosis not present

## 2015-10-14 DIAGNOSIS — C61 Malignant neoplasm of prostate: Secondary | ICD-10-CM | POA: Diagnosis not present

## 2015-10-14 MED ORDER — SILDENAFIL CITRATE 50 MG PO TABS: 50.0000 mg | ORAL_TABLET | ORAL | Status: DC | PRN

## 2015-10-14 MED ORDER — AZITHROMYCIN 250 MG PO TABS: ORAL_TABLET | ORAL | Status: DC
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
## 2015-10-14 NOTE — Assessment & Plan Note (Signed)
Encouraged smoking cessation 
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
## 2015-10-14 NOTE — Assessment & Plan Note (Signed)
S/p prostatectomy in 2012 per patient. Followed by  [facility] urology per patient but no visits since 2014.

## 2015-10-14 NOTE — Assessment & Plan Note (Addendum)
Pt still experiencing some chest wall pain from fracture- unclear if this CAP vs fracture. Continue oxycodone and tylenol PRN.

## 2015-10-14 NOTE — Assessment & Plan Note (Signed)
S/p prostate cancer. Renewed viagra. Discussed referral to urology since pt does not feel viagra has helped. Pt declined at this time.

## 2015-10-14 NOTE — Assessment & Plan Note (Signed)
Followed by [facility] ortho. Pt encouraged to keep follow-up appt. Continue knee immobilizer.

## 2015-10-17 ENCOUNTER — Other Ambulatory Visit: Payer: Self-pay | Admitting: Family Medicine

## 2015-10-17 ENCOUNTER — Telehealth: Payer: Self-pay | Admitting: Family Medicine

## 2015-10-17 DIAGNOSIS — J189 Pneumonia, unspecified organism: Secondary | ICD-10-CM

## 2015-10-17 DIAGNOSIS — N529 Male erectile dysfunction, unspecified: Secondary | ICD-10-CM

## 2015-10-17 MED ORDER — AZITHROMYCIN 250 MG PO TABS: ORAL_TABLET | ORAL | Status: DC

## 2015-10-17 MED ORDER — SILDENAFIL CITRATE 50 MG PO TABS: 50.0000 mg | ORAL_TABLET | ORAL | Status: DC | PRN

## 2015-10-17 NOTE — Telephone Encounter (Signed)
Pt. Called states that his medication was not at [facility]. Pt call back # is [phone]

## 2015-10-17 NOTE — Telephone Encounter (Signed)
Pt wanted to know which pharmacy the prescriptions were sent to .  His call back number is [phone]

## 2015-10-17 NOTE — Telephone Encounter (Signed)
rx sent to [facility].

## 2015-11-02 ENCOUNTER — Ambulatory Visit (INDEPENDENT_AMBULATORY_CARE_PROVIDER_SITE_OTHER): Payer: BLUE CROSS/BLUE SHIELD | Admitting: Family Medicine

## 2015-11-02 ENCOUNTER — Ambulatory Visit
Admission: RE | Admit: 2015-11-02 | Discharge: 2015-11-02 | Disposition: A | Discharge status: Home / Self Care | Payer: BLUE CROSS/BLUE SHIELD | Source: Ambulatory Visit | Attending: Family Medicine | Admitting: Family Medicine

## 2015-11-02 VITALS — BP 121/81 | HR 87 | Temp 98.1°F | Resp 16 | Ht 70.0 in | Wt 169.0 lb

## 2015-11-02 DIAGNOSIS — Y939 Activity, unspecified: Secondary | ICD-10-CM | POA: Insufficient documentation

## 2015-11-02 DIAGNOSIS — J189 Pneumonia, unspecified organism: Secondary | ICD-10-CM | POA: Diagnosis present

## 2015-11-02 DIAGNOSIS — Z1211 Encounter for screening for malignant neoplasm of colon: Secondary | ICD-10-CM

## 2015-11-02 DIAGNOSIS — J181 Lobar pneumonia, unspecified organism: Principal | ICD-10-CM

## 2015-11-02 DIAGNOSIS — S2220XA Unspecified fracture of sternum, initial encounter for closed fracture: Secondary | ICD-10-CM | POA: Insufficient documentation

## 2015-11-02 NOTE — Patient Instructions (Addendum)
We will get a chest XR to ensure the pneumonia is resolved.  Please get your labs drawn- either at work or here at the office. You can make an appt with Rachell upfront for lab work.
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
## 2015-11-23 ENCOUNTER — Telehealth: Payer: Self-pay

## 2015-11-23 ENCOUNTER — Other Ambulatory Visit: Payer: Self-pay

## 2015-11-23 NOTE — Telephone Encounter (Signed)
Gastroenterology Pre-Procedure Review  Request Date: 12/16/15 Requesting Physician: Cecille Amsterdam, NP  PATIENT REVIEW QUESTIONS: The patient responded to the following health history questions as indicated:    1. Are you having any GI issues? no 2. Do you have a personal history of Polyps? yes (repeat 5 years) 3. Do you have a family history of Colon Cancer or Polyps? no 4. Diabetes Mellitus? no 5. Joint replacements in the past 12 months?no 6. Major health problems in the past 3 months?no 7. Any artificial heart valves, MVP, or defibrillator?no    MEDICATIONS & ALLERGIES:    Patient reports the following regarding taking any anticoagulation/antiplatelet therapy:   Plavix, Coumadin, Eliquis, Xarelto, Lovenox, Pradaxa, Brilinta, or Effient? no Aspirin? no  Patient confirms/reports the following medications:  Current Outpatient Prescriptions  Medication Sig Dispense Refill  . oxyCODONE (OXY IR/ROXICODONE) 5 MG immediate release tablet TAKE 1 TABLET BY MOUTH EVERY 4 HOURS AS NEEDED FOR PAIN FOR UP TO 14 DAYS  0  . sildenafil (VIAGRA) 50 MG tablet Take 1 tablet (50 mg total) by mouth as needed for erectile dysfunction. 10 tablet 1   No current facility-administered medications for this visit.    Patient confirms/reports the following allergies:  Allergies  Allergen Reactions  . Shellfish-Derived Products Hives  . Other Hives    Shellfish. Shellfish  . Shellfish Allergy Hives  . Iopamidol Other (See Comments)    Sneezing,Congestion,Dry Mouth    No orders of the defined types were placed in this encounter.    AUTHORIZATION INFORMATION Primary Insurance: 1D#: Group #:  Secondary Insurance: 1D#: Group #:  SCHEDULE INFORMATION: Date: 12/16/15 Time: Location: [place]

## 2015-12-02 ENCOUNTER — Other Ambulatory Visit: Payer: Self-pay | Admitting: Specialist

## 2015-12-02 DIAGNOSIS — Z87891 Personal history of nicotine dependence: Secondary | ICD-10-CM

## 2015-12-02 DIAGNOSIS — R918 Other nonspecific abnormal finding of lung field: Secondary | ICD-10-CM

## 2015-12-30 ENCOUNTER — Encounter: Payer: Self-pay | Admitting: *Deleted

## 2016-01-02 NOTE — Discharge Instructions (Signed)

## 2016-01-06 ENCOUNTER — Ambulatory Visit: Payer: BLUE CROSS/BLUE SHIELD | Admitting: Anesthesiology

## 2016-01-06 ENCOUNTER — Encounter: Payer: Self-pay | Admitting: *Deleted

## 2016-01-06 ENCOUNTER — Ambulatory Visit
Admission: RE | Admit: 2016-01-06 | Discharge: 2016-01-06 | Disposition: A | Discharge status: Home / Self Care | Payer: BLUE CROSS/BLUE SHIELD | Source: Ambulatory Visit | Attending: Gastroenterology | Admitting: Gastroenterology

## 2016-01-06 ENCOUNTER — Encounter
Admission: RE | Disposition: A | Discharge status: Home / Self Care | Payer: Self-pay | Source: Ambulatory Visit | Attending: Gastroenterology

## 2016-01-06 DIAGNOSIS — Z79891 Long term (current) use of opiate analgesic: Secondary | ICD-10-CM | POA: Insufficient documentation

## 2016-01-06 DIAGNOSIS — Z860100 Personal history of colon polyps, unspecified: Secondary | ICD-10-CM

## 2016-01-06 DIAGNOSIS — Z888 Allergy status to other drugs, medicaments and biological substances status: Secondary | ICD-10-CM | POA: Insufficient documentation

## 2016-01-06 DIAGNOSIS — D124 Benign neoplasm of descending colon: Secondary | ICD-10-CM | POA: Insufficient documentation

## 2016-01-06 DIAGNOSIS — Z87891 Personal history of nicotine dependence: Secondary | ICD-10-CM | POA: Insufficient documentation

## 2016-01-06 DIAGNOSIS — N529 Male erectile dysfunction, unspecified: Secondary | ICD-10-CM | POA: Insufficient documentation

## 2016-01-06 DIAGNOSIS — Z1211 Encounter for screening for malignant neoplasm of colon: Secondary | ICD-10-CM | POA: Diagnosis not present

## 2016-01-06 DIAGNOSIS — Z79899 Other long term (current) drug therapy: Secondary | ICD-10-CM | POA: Insufficient documentation

## 2016-01-06 DIAGNOSIS — Z8546 Personal history of malignant neoplasm of prostate: Secondary | ICD-10-CM | POA: Diagnosis not present

## 2016-01-06 DIAGNOSIS — K641 Second degree hemorrhoids: Secondary | ICD-10-CM | POA: Diagnosis not present

## 2016-01-06 DIAGNOSIS — Z9079 Acquired absence of other genital organ(s): Secondary | ICD-10-CM | POA: Diagnosis not present

## 2016-01-06 DIAGNOSIS — Z91013 Allergy to seafood: Secondary | ICD-10-CM | POA: Insufficient documentation

## 2016-01-06 HISTORY — PX: COLONOSCOPY WITH PROPOFOL: SHX5780

## 2016-01-06 HISTORY — DX: Unspecified fracture of shaft of unspecified tibia, initial encounter for closed fracture: S82.209A

## 2016-01-06 HISTORY — PX: POLYPECTOMY: SHX5525

## 2016-01-06 HISTORY — DX: Presence of dental prosthetic device (complete) (partial): Z97.2

## 2016-01-06 HISTORY — DX: Unspecified fracture of sternum, initial encounter for closed fracture: S22.20XA

## 2016-01-06 SURGERY — COLONOSCOPY WITH PROPOFOL
Anesthesia: Monitor Anesthesia Care | Site: Rectum | Wound class: Contaminated

## 2016-01-06 MED ORDER — OXYCODONE HCL 5 MG/5ML PO SOLN: 5.0000 mg | Freq: Once | ORAL | Status: DC | PRN

## 2016-01-06 MED ORDER — LACTATED RINGERS IV SOLN: INTRAVENOUS | Status: DC

## 2016-01-06 MED ORDER — OXYCODONE HCL 5 MG PO TABS: 5.0000 mg | ORAL_TABLET | Freq: Once | ORAL | Status: DC | PRN

## 2016-01-06 MED ORDER — STERILE WATER FOR IRRIGATION IR SOLN
Status: DC | PRN
  Administered 2016-01-06: 10:00:00

## 2016-01-06 MED ORDER — PROPOFOL 10 MG/ML IV BOLUS
INTRAVENOUS | Status: DC | PRN
  Administered 2016-01-06 (×2): 50 mg via INTRAVENOUS
  Administered 2016-01-06: 100 mg via INTRAVENOUS
  Administered 2016-01-06 (×2): 50 mg via INTRAVENOUS

## 2016-01-06 MED ORDER — LIDOCAINE HCL (CARDIAC) 20 MG/ML IV SOLN
INTRAVENOUS | Status: DC | PRN
  Administered 2016-01-06: 50 mg via INTRAVENOUS

## 2016-01-06 MED ORDER — LACTATED RINGERS IV SOLN
INTRAVENOUS | Status: DC | PRN
  Administered 2016-01-06: 09:00:00 via INTRAVENOUS

## 2016-01-06 SURGICAL SUPPLY — 23 items
CANISTER SUCT 1200ML W/VALVE (MISCELLANEOUS) ×4 IMPLANT
CLIP HMST 235XBRD CATH ROT (MISCELLANEOUS) IMPLANT
CLIP RESOLUTION 360 11X235 (MISCELLANEOUS)
FCP ESCP3.2XJMB 240X2.8X (MISCELLANEOUS)
FORCEPS BIOP RAD 4 LRG CAP 4 (CUTTING FORCEPS) IMPLANT
FORCEPS BIOP RJ4 240 W/NDL (MISCELLANEOUS)
FORCEPS ESCP3.2XJMB 240X2.8X (MISCELLANEOUS) IMPLANT
GOWN CVR UNV OPN BCK APRN NK (MISCELLANEOUS) ×4 IMPLANT
GOWN ISOL THUMB LOOP REG UNIV (MISCELLANEOUS) ×4
INJECTOR VARIJECT VIN23 (MISCELLANEOUS) IMPLANT
KIT DEFENDO VALVE AND CONN (KITS) IMPLANT
KIT ENDO PROCEDURE OLY (KITS) ×4 IMPLANT
MARKER SPOT ENDO TATTOO 5ML (MISCELLANEOUS) IMPLANT
PAD GROUND ADULT SPLIT (MISCELLANEOUS) IMPLANT
PROBE APC STR FIRE (PROBE) IMPLANT
RETRIEVER NET ROTH 2.5X230 LF (MISCELLANEOUS) ×4 IMPLANT
SNARE SHORT THROW 13M SML OVAL (MISCELLANEOUS) ×4 IMPLANT
SNARE SHORT THROW 30M LRG OVAL (MISCELLANEOUS) IMPLANT
SNARE SNG USE RND 15MM (INSTRUMENTS) IMPLANT
SPOT EX ENDOSCOPIC TATTOO (MISCELLANEOUS)
TRAP ETRAP POLY (MISCELLANEOUS) ×4 IMPLANT
VARIJECT INJECTOR VIN23 (MISCELLANEOUS)
WATER STERILE IRR 250ML POUR (IV SOLUTION) ×4 IMPLANT

## 2016-01-06 NOTE — Op Note (Signed)
[facility] Gastroenterology Patient Name: Justin Mckee Procedure Date: 01/06/2016 9:53 AM MRN: [id number] Account #: [id number] Date of Birth: Oct 31, 1955 Admit Type: Outpatient Age: 60 Room: [facility] OR ROOM 01 Gender: Male Note Status: Finalized Procedure:            Colonoscopy Indications:          High risk colon cancer surveillance: Personal history                        of colonic polyps Providers:            Lucilla Lame MD, MD Referring MD:         Leata Mouse (Referring MD) Medicines:            Propofol per Anesthesia Complications:        No immediate complications. Procedure:            Pre-Anesthesia Assessment:                       - Prior to the procedure, a History and Physical was                        performed, and patient medications and allergies were                        reviewed. The patient's tolerance of previous                        anesthesia was also reviewed. The risks and benefits of                        the procedure and the sedation options and risks were                        discussed with the patient. All questions were                        answered, and informed consent was obtained. Prior                        Anticoagulants: The patient has taken no previous                        anticoagulant or antiplatelet agents. ASA Grade                        Assessment: II - A patient with mild systemic disease.                        After reviewing the risks and benefits, the patient was                        deemed in satisfactory condition to undergo the                        procedure.                       After obtaining informed consent, the colonoscope was  passed under direct vision. Throughout the procedure,                        the patient's blood pressure, pulse, and oxygen                        saturations were monitored continuously. The Olympus CF                        H180AL  colonoscope (S#: [id number]) was introduced through                        the anus and advanced to the the cecum, identified by                        appendiceal orifice and ileocecal valve. The                        colonoscopy was performed without difficulty. The                        patient tolerated the procedure well. The quality of                        the bowel preparation was excellent. Findings:      The perianal and digital rectal examinations were normal.      A 6 mm polyp was found in the descending colon. The polyp was sessile.       The polyp was removed with a cold snare. Resection and retrieval were       complete.      Non-bleeding internal hemorrhoids were found during retroflexion. The       hemorrhoids were Grade II (internal hemorrhoids that prolapse but reduce       spontaneously). Impression:           - One 6 mm polyp in the descending colon, removed with                        a cold snare. Resected and retrieved.                       - Non-bleeding internal hemorrhoids. Recommendation:       - Await pathology results.                       - Repeat colonoscopy in 5 years if polyp adenoma and 10                        years if hyperplastic Procedure Code(s):    --- Professional ---                       [phone], Colonoscopy, flexible; with removal of tumor(s),                        polyp(s), or other lesion(s) by snare technique Diagnosis Code(s):    --- Professional ---                       Z86.010, Personal history of colonic polyps  D12.4, Benign neoplasm of descending colon CPT copyright 2016 American Medical Association. All rights reserved. The codes documented in this report are preliminary and upon coder review may  be revised to meet current compliance requirements. Lucilla Lame MD, MD 01/06/2016 10:10:22 AM This report has been signed electronically. Number of Addenda: 0 Note Initiated On: 01/06/2016 9:53 AM Scope Withdrawal  Time: 0 hours 8 minutes 2 seconds  Total Procedure Duration: 0 hours 10 minutes 26 seconds       [facility]
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
## 2016-01-06 NOTE — Anesthesia Postprocedure Evaluation (Signed)
Anesthesia Post Note  Patient: Justin Mckee  Procedure(s) Performed: Procedure(s) (LRB): COLONOSCOPY WITH PROPOFOL (N/A) POLYPECTOMY  Patient location during evaluation: PACU Anesthesia Type: MAC Level of consciousness: awake and alert Pain management: pain level controlled Vital Signs Assessment: post-procedure vital signs reviewed and stable Respiratory status: spontaneous breathing, nonlabored ventilation, respiratory function stable and patient connected to nasal cannula oxygen Cardiovascular status: stable and blood pressure returned to baseline Anesthetic complications: no    Francenia Chimenti

## 2016-01-06 NOTE — Transfer of Care (Signed)
Immediate Anesthesia Transfer of Care Note  Patient: Justin Mckee  Procedure(s) Performed: Procedure(s) with comments: COLONOSCOPY WITH PROPOFOL (N/A) POLYPECTOMY - descending colon polyp  Patient Location: PACU  Anesthesia Type: MAC  Level of Consciousness: awake, alert  and patient cooperative  Airway and Oxygen Therapy: Patient Spontanous Breathing and Patient connected to supplemental oxygen  Post-op Assessment: Post-op Vital signs reviewed, Patient's Cardiovascular Status Stable, Respiratory Function Stable, Patent Airway and No signs of Nausea or vomiting  Post-op Vital Signs: Reviewed and stable  Complications: No apparent anesthesia complications
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
## 2016-01-06 NOTE — Anesthesia Procedure Notes (Signed)
Procedure Name: MAC Performed by: Keviana Guida Pre-anesthesia Checklist: Patient identified, Emergency Drugs available, Suction available, Timeout performed and Patient being monitored Patient Re-evaluated:Patient Re-evaluated prior to inductionOxygen Delivery Method: Nasal cannula Placement Confirmation: positive ETCO2     

## 2016-01-09 ENCOUNTER — Encounter: Payer: Self-pay | Admitting: Gastroenterology

## 2016-01-10 ENCOUNTER — Encounter: Payer: Self-pay | Admitting: Gastroenterology

## 2016-01-11 ENCOUNTER — Encounter: Payer: Self-pay | Admitting: Gastroenterology

## 2016-05-24 ENCOUNTER — Other Ambulatory Visit: Payer: Self-pay | Admitting: Specialist

## 2016-05-24 DIAGNOSIS — R918 Other nonspecific abnormal finding of lung field: Secondary | ICD-10-CM | POA: Insufficient documentation

## 2016-07-24 ENCOUNTER — Ambulatory Visit: Payer: BLUE CROSS/BLUE SHIELD

## 2016-08-02 ENCOUNTER — Ambulatory Visit: Payer: BLUE CROSS/BLUE SHIELD

## 2016-08-21 ENCOUNTER — Encounter: Payer: Self-pay | Admitting: *Deleted

## 2016-08-21 ENCOUNTER — Encounter: Payer: Self-pay | Admitting: Family Medicine

## 2016-08-21 ENCOUNTER — Ambulatory Visit (INDEPENDENT_AMBULATORY_CARE_PROVIDER_SITE_OTHER): Payer: BLUE CROSS/BLUE SHIELD | Admitting: Family Medicine

## 2016-08-21 VITALS — BP 96/84 | HR 72 | Temp 98.0°F | Resp 16 | Ht 70.0 in | Wt 190.0 lb

## 2016-08-21 DIAGNOSIS — Z0000 Encounter for general adult medical examination without abnormal findings: Secondary | ICD-10-CM

## 2016-08-21 NOTE — Progress Notes (Signed)
Spoke with patient today. Mutual decision to not proceed with an actual office visit due to patient not having any specific new complaint or any follow-up request, last visit from previous PCP Amy Krebs FNP seen in almost 1 year ago in 10/2015.  I had not previously seen patient before today. Discussed that most likely he would transition care to the new hire NP at our practice and we offered option to cancel visit for today and re-schedule to establish care with new NP in 3-4 weeks. He agrees to this option.
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
## 2016-10-08 ENCOUNTER — Encounter: Payer: Self-pay | Admitting: Family Medicine

## 2016-10-08 ENCOUNTER — Ambulatory Visit (INDEPENDENT_AMBULATORY_CARE_PROVIDER_SITE_OTHER): Payer: BLUE CROSS/BLUE SHIELD | Admitting: Family Medicine

## 2016-10-08 VITALS — BP 130/80 | HR 71 | Temp 98.0°F | Resp 16 | Ht 70.0 in | Wt 186.0 lb

## 2016-10-08 DIAGNOSIS — R030 Elevated blood-pressure reading, without diagnosis of hypertension: Secondary | ICD-10-CM | POA: Diagnosis not present

## 2016-10-08 DIAGNOSIS — K219 Gastro-esophageal reflux disease without esophagitis: Secondary | ICD-10-CM

## 2016-10-08 DIAGNOSIS — Z8546 Personal history of malignant neoplasm of prostate: Secondary | ICD-10-CM

## 2016-10-08 DIAGNOSIS — I10 Essential (primary) hypertension: Secondary | ICD-10-CM | POA: Insufficient documentation

## 2016-10-08 MED ORDER — OMEPRAZOLE 20 MG PO CPDR: 20.0000 mg | DELAYED_RELEASE_CAPSULE | Freq: Every day | ORAL | 5 refills | Status: DC

## 2016-10-08 NOTE — Patient Instructions (Signed)
Thank you for coming to the clinic today.  1. Your symptoms sound most consistent with Heart Burn or Acid Reflux - Stop taking Ranitidine (Zantac) - Start Omeprazole 20mg  daily, take one capsule 30 min before first meal of day, same time every day for at least 2 weeks, max treatment up to 4 weeks then stop. If it resolves but then comes back again, you can repeat the 2-4 week course again as needed. - Avoid spicy, tomato sauce, greasy, fried foods, also things like caffeine, dark chocolate, peppermint can worsen - Avoid large meals and late night snacks, also do not go more than 4-5 hours without a snack or meal (not eating will worsen reflux symptoms due to stomach acid)  If the problem improves but keeps coming back, we can discuss higher dose or longer course at next visit.  If symptoms are worsening, persistent symptoms despite treatment or develop esophageal or abdominal pain, unable to swallow solids or liquids, nausea, vomiting, fever/chills, or unintentional weight loss / no appetite, please follow-up sooner or seek more immediate medical attention.  Get your blood work in June, fax [place] a copy of the result.  If we need any other testing. Then I will add on labs, we will schedule you a Lab Only apt here to get blood drawn  Please schedule a follow-up appointment with Dr. Parks Ranger in 2-3 months for Annual Physical  If you have any other questions or concerns, please feel free to call the clinic or send a message through [place]. You may also schedule an earlier appointment if necessary.  Nobie Putnam, DO [place]

## 2016-10-08 NOTE — Assessment & Plan Note (Signed)
Elevated initial BP, prior chart review with several other elevated readings but rarely, mostly has controlled BP, not checking outside office. Manual repeat is normal range today, reassuring. No known complications  Plan: 1. Reassurance 2. Continue improve lifestyle modifications, lower sodium in diet, improve water, limit caffeine and alcohol 3. Follow-up as needed monitor BP, future labs in 3 months for annual physical

## 2016-10-08 NOTE — Assessment & Plan Note (Addendum)
S/p Prostatectomy 2012 [facility] Has not returned to follow-up with Urology Last PSA <0.01 (2013) Awaiting upcoming lab results from employee health or future labs to be drawn in June 2018, to be faxed to our office, will review, if need additional testing will add on other labs, if PSA is abnormal will refer back to Urology either locally or back to [facility]

## 2016-10-08 NOTE — Assessment & Plan Note (Signed)
Suspected chronic GERD with recent flare, due to trigger foods, regular alcohol consumption - No GI red flag symptoms. Exam is unremarkable with benign abdomen without pain today - No prior chronic history of GERD, trial on H2 blocker for >1 month mixed results, unsure dose 75 vs 150mg  BID - History not suggestive of PUD (no regular NSAID use, pain not improved with eating, not long term uncontrolled GERD)  Plan: 1. Discontinue OTC Ranitidine 2. Start rx Omeprazole 20mg  daily 30 min prior to 1st meal for 2 to 4 weeks, may need repeat course in future if recurrence 3. Diet modifications reduce GERD, reduce alcohol, soda caffeine, greasy/fried foods, reduce size of dinner, smaller more frequent meals, afternoon snack, avoid late night eating 4. Can consider Carafate PRN if needed in future, or possibly H pylori testing if still not responding. 5. Follow-up 4-6 weeks as needed, may consider prolonged course, BID, or change to alternative PPI such as protonix - otherwise if improved follow-up 3 months for Annual Physical

## 2016-10-08 NOTE — Progress Notes (Signed)
Subjective:    Patient ID: Justin Mckee, male    DOB: Dec 03, 1955, 61 y.o.   MRN: [id number]  Justin Mckee is a 61 y.o. male presenting on 10/08/2016 for Gastroesophageal Reflux (as per patient every time he eat something feels burning onset couple of months )   HPI   GERD: - Reports concerns with acid reflux with heartburn symptoms. He has been taking OTC Zantac 75 or 150mg  twice daily, has had mixed results, some relief but still has "burning" and upset stomach most days. Symptoms not worse at night, but worse after eating, some foods worse such as tomato sauce. Also admits to drinking beer and alcohol often, usually does not worsen symptoms at time of alcohol consumption, also drinks soda and caffeine, eats larger meal in evening, other meals are smaller buts 3 regular meals daily. - Admits to some occasional nausea, without vomiting - Denies active abdominal pain, dark stools, blood in stool, diarrhea, constipation, bloating, globus sensation, chronic cough, regurgitation  Elevated BP without diagnosis of HTN: Reports no prior concerns with HTN, occasionally borderline elevated BP. Current Meds - None   Diet - see above, caffeine, eats pork and other meats regularly, does not eat low salt diet Denies CP, dyspnea, HA, edema, dizziness / lightheadedness  H/o Prostate Cancer - S/p prostatectomy in 2012, by [facility], had been only followed by prior PCP here by Fredia Sorrow FNP with PSA tests recently, does not have [facility], has not been back to them since 2014. - Last PSA in 2013 negative, none recently - Patient reports he plans to get labs through work for monitoring PSA, does not need new orders, anticipated in June, will fax [place] copy of results   Social History  Substance Use Topics  . Smoking status: Former Smoker    Packs/day: 1.00    Years: 40.00    Types: Cigarettes  . Smokeless tobacco: Former Systems developer    Quit date: 08/28/2015  . Alcohol use 14.4 oz/week    24  Cans of beer per week     Comment:      Review of Systems Per HPI unless specifically indicated above     Objective:    BP 130/80 (BP Location: Left Arm, Cuff Size: Normal)   Pulse 71   Temp 98 F (36.7 C) (Oral)   Resp 16   Ht 5\' 10"  (1.778 m)   Wt 186 lb (84.4 kg)   BMI 26.69 kg/m   Wt Readings from Last 3 Encounters:  10/08/16 186 lb (84.4 kg)  08/21/16 190 lb (86.2 kg)  01/06/16 176 lb (79.8 kg)    Physical Exam  Constitutional: He is oriented to person, place, and time. He appears well-developed and well-nourished. No distress.  Well-appearing, comfortable, cooperative  HENT:  Head: Normocephalic and atraumatic.  Mouth/Throat: Oropharynx is clear and moist.  Poor dentition. Moist mucus mem.  Eyes: Conjunctivae are normal.  Cardiovascular: Normal rate, regular rhythm, normal heart sounds and intact distal pulses.   No murmur heard. Pulmonary/Chest: Effort normal and breath sounds normal. No respiratory distress. He has no wheezes. He has no rales.  Abdominal: Soft. Bowel sounds are normal. He exhibits no distension and no mass. There is no tenderness. There is no rebound.  Negative RUQ Murphy's sign on inspiration. No hepatomegaly on percussion, normal margins. Non tender epigastric.  Neurological: He is alert and oriented to person, place, and time.  Skin: Skin is warm and dry. No rash noted. He is not diaphoretic.  No erythema.  Psychiatric: He has a normal mood and affect. His behavior is normal.  Nursing note and vitals reviewed.  Results for orders placed or performed in visit on 10/14/15  HM COLONOSCOPY  Result Value Ref Range   HM Colonoscopy Patient Reported Normal See Report, Patient Reported Normal      Assessment & Plan:   Problem List Items Addressed This Visit    History of prostate cancer    S/p Prostatectomy 2012 [facility] Has not returned to follow-up with Urology Last PSA <0.01 (2013) Awaiting upcoming lab results from employee health or  future labs to be drawn in June 2018, to be faxed to our office, will review, if need additional testing will add on other labs, if PSA is abnormal will refer back to Urology either locally or back to [facility]      GERD (gastroesophageal reflux disease) - Primary    Suspected chronic GERD with recent flare, due to trigger foods, regular alcohol consumption - No GI red flag symptoms. Exam is unremarkable with benign abdomen without pain today - No prior chronic history of GERD, trial on H2 blocker for >1 month mixed results, unsure dose 75 vs 150mg  BID - History not suggestive of PUD (no regular NSAID use, pain not improved with eating, not long term uncontrolled GERD)  Plan: 1. Discontinue OTC Ranitidine 2. Start rx Omeprazole 20mg  daily 30 min prior to 1st meal for 2 to 4 weeks, may need repeat course in future if recurrence 3. Diet modifications reduce GERD, reduce alcohol, soda caffeine, greasy/fried foods, reduce size of dinner, smaller more frequent meals, afternoon snack, avoid late night eating 4. Can consider Carafate PRN if needed in future, or possibly H pylori testing if still not responding. 5. Follow-up 4-6 weeks as needed, may consider prolonged course, BID, or change to alternative PPI such as protonix - otherwise if improved follow-up 3 months for Annual Physical      Relevant Medications   omeprazole (PRILOSEC) 20 MG capsule   Elevated BP without diagnosis of hypertension    Elevated initial BP, prior chart review with several other elevated readings but rarely, mostly has controlled BP, not checking outside office. Manual repeat is normal range today, reassuring. No known complications  Plan: 1. Reassurance 2. Continue improve lifestyle modifications, lower sodium in diet, improve water, limit caffeine and alcohol 3. Follow-up as needed monitor BP, future labs in 3 months for annual physical         Meds ordered this encounter  Medications  . DISCONTD:  chlorpheniramine-HYDROcodone (TUSSIONEX) 10-8 MG/5ML SUER    Sig: TAKE 1 TEASPOONFUL (5MLS) TWICE A DAY AS NEEDED FOR COUGH    Refill:  0  . omeprazole (PRILOSEC) 20 MG capsule    Sig: Take 1 capsule (20 mg total) by mouth daily before breakfast. About 30 min before 1st meal of day    Dispense:  30 capsule    Refill:  5     Follow up plan: Return in about 3 months (around 01/07/2017) for Annual Physical.  Will check future labs after review outside labs in June 2018 to determine if additional labs needed, will need chemistry, lipids, CBC, PSA, A1c.  Nobie Putnam, [facility] 10/08/2016, 4:56 PM
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
## 2016-11-13 ENCOUNTER — Ambulatory Visit: Payer: BLUE CROSS/BLUE SHIELD

## 2016-11-16 ENCOUNTER — Ambulatory Visit: Payer: BLUE CROSS/BLUE SHIELD

## 2016-11-22 ENCOUNTER — Ambulatory Visit: Payer: BLUE CROSS/BLUE SHIELD

## 2016-11-29 LAB — CBC AND DIFFERENTIAL
HCT: 41 (ref 41–53)
Hemoglobin: 12.9 — AB (ref 13.5–17.5)
Neutrophils Absolute: 3
PLATELETS: 353 (ref 150–399)
WBC: 6.2

## 2016-11-29 LAB — BASIC METABOLIC PANEL
BUN: 14 (ref 4–21)
CREATININE: 1.1 (ref <=1.3)
GLUCOSE: 107
POTASSIUM: 4.1 (ref 3.4–5.3)
SODIUM: 139 (ref 137–147)

## 2016-11-29 LAB — HEMOGLOBIN A1C: Hemoglobin A1C: 6.2

## 2016-11-29 LAB — LIPID PANEL
Cholesterol: 194 (ref 0–200)
HDL: 49 (ref 35–70)
LDL Cholesterol: 133
Triglycerides: 62 (ref 40–160)

## 2016-11-29 LAB — PSA: PSA: 0.1

## 2016-11-29 LAB — HEPATIC FUNCTION PANEL
ALT: 26 (ref 10–40)
AST: 25 (ref 14–40)
Alkaline Phosphatase: 37 (ref 25–125)
BILIRUBIN, TOTAL: 0.6

## 2016-11-29 LAB — VITAMIN D 25 HYDROXY (VIT D DEFICIENCY, FRACTURES): VIT D 25 HYDROXY: 16

## 2016-11-29 LAB — TSH: TSH: 3.15 (ref <=5.90)

## 2016-11-30 LAB — TSH+T3+FREE T4+T3 FREE
Free Thyroxine Index: 1.6
T3 Uptake: 26
THYROXINE (T4): 6

## 2016-11-30 LAB — URIC ACID: Uric Acid: 7.6

## 2017-01-07 ENCOUNTER — Encounter: Payer: Self-pay | Admitting: Family Medicine

## 2017-01-07 ENCOUNTER — Ambulatory Visit (INDEPENDENT_AMBULATORY_CARE_PROVIDER_SITE_OTHER): Payer: BLUE CROSS/BLUE SHIELD | Admitting: Family Medicine

## 2017-01-07 VITALS — BP 136/82 | HR 77 | Temp 98.3°F | Resp 16 | Ht 70.0 in | Wt 188.0 lb

## 2017-01-07 DIAGNOSIS — D124 Benign neoplasm of descending colon: Secondary | ICD-10-CM

## 2017-01-07 DIAGNOSIS — Z0000 Encounter for general adult medical examination without abnormal findings: Secondary | ICD-10-CM

## 2017-01-07 DIAGNOSIS — R7303 Prediabetes: Secondary | ICD-10-CM | POA: Insufficient documentation

## 2017-01-07 DIAGNOSIS — E559 Vitamin D deficiency, unspecified: Secondary | ICD-10-CM

## 2017-01-07 DIAGNOSIS — Z8781 Personal history of (healed) traumatic fracture: Secondary | ICD-10-CM

## 2017-01-07 DIAGNOSIS — R030 Elevated blood-pressure reading, without diagnosis of hypertension: Secondary | ICD-10-CM | POA: Diagnosis not present

## 2017-01-07 DIAGNOSIS — K219 Gastro-esophageal reflux disease without esophagitis: Secondary | ICD-10-CM

## 2017-01-07 DIAGNOSIS — M1731 Unilateral post-traumatic osteoarthritis, right knee: Secondary | ICD-10-CM | POA: Insufficient documentation

## 2017-01-07 DIAGNOSIS — Z8546 Personal history of malignant neoplasm of prostate: Secondary | ICD-10-CM | POA: Diagnosis not present

## 2017-01-07 MED ORDER — DICLOFENAC SODIUM 1 % TD GEL: 2.0000 g | Freq: Three times a day (TID) | TRANSDERMAL | 2 refills | Status: DC

## 2017-01-07 NOTE — Patient Instructions (Addendum)
Thank you for coming to the clinic today.  1.  Low Vitamin D - try taking OTC Vitamin D3 5,000 unit daily for 12 weeks  2. Ask employee health to check routine screen Hepatitis C antibody and HIV antibody  3. Elevated A1c 6.2, this is pre-Diabetes range - will re-check here in 3 months  4. For R knee most likely traumatic arthritis from prior fracture - we sent new rx for Diclofenac (voltaren) gel use 2-3 times a day on Right knee for pain relief. Safer than ibuprofen.  Recommend to start taking Tylenol Extra Strength 500mg  tabs - take 1 to 2 tabs per dose (max 1000mg ) every 6-8 hours for pain (take regularly, don't skip a dose for next 7 days), max 24 hour daily dose is 6 tablets or 3000mg . In the future you can repeat the same everyday Tylenol course for 1-2 weeks at a time.   Use knee sleeve as needed.  In future if not improved, we can consider steroid injection into knee, or X-ray or either physical therapy or Orthopedics (back to [facility] or elsewhere)  Please schedule a Follow-up Appointment to: Return in about 3 months (around 04/09/2017) for Pre-Diabetes A1c, R knee pain OA.  If you have any other questions or concerns, please feel free to call the clinic or send a message through [place]. You may also schedule an earlier appointment if necessary.  Additionally, you may be receiving a survey about your experience at our clinic within a few days to 1 week by e-mail or mail. We value your feedback.  Nobie Putnam, DO [place]

## 2017-01-07 NOTE — Assessment & Plan Note (Signed)
New dx Pre-DM with A1c 6.2, no comparison  Plan:  1. Not on any therapy currently  2. Encourage improved lifestyle - low carb, low sugar diet, reduce portion size, start regular exercise 3. Follow-up 3 months Pre DM A1c POC

## 2017-01-07 NOTE — Assessment & Plan Note (Signed)
Stable without evidence of recurrence Last PSA 0.1 (11/2016) from employee health S/p Prostatectomy 2012 [facility] Has not returned to follow-up with Urology, if abnormal PSA or other concern for recurrence will refer back to Uro

## 2017-01-07 NOTE — Assessment & Plan Note (Addendum)
Stable to improved today. Outside BP readings normal at work. No known complications  Plan: 1. Reassurance 2. Continue improve lifestyle modifications, lower sodium in diet, improve water, limit caffeine and alcohol 3. Follow-up as needed monitor BP

## 2017-01-07 NOTE — Assessment & Plan Note (Signed)
Low Vit D 16 Start OTC Vitamin D3 5,000 daily for 12 weeks

## 2017-01-07 NOTE — Assessment & Plan Note (Addendum)
Underlying etiology for chronic R knee OA/DJD and chronic pain

## 2017-01-07 NOTE — Assessment & Plan Note (Signed)
Controlled on PPI daily, no red flag symptoms Continue PPI 20mg  daily for now Diet to reduce GERD Follow-up PRN

## 2017-01-07 NOTE — Assessment & Plan Note (Signed)
Adenoma, on colonoscopy 12/2015, reviewed path report Follow-up [facility] in 12/2020 for repeat colonoscopy

## 2017-01-07 NOTE — Assessment & Plan Note (Signed)
Chronic progression R medial vs generalized Knee pain Followed by [facility] 12/2015 - Prior known injury with fracture s/p surgical repair. Known OA/DJD - Able to bear weight, no knee instability or mechanical locking - Inadequate conservative therapy - Prior trials of PT with improvement - Failed Ibuprofen, Naproxen, Meloxicam (cannot tolerate due to history of GERD)  Plan: 1. Reviewed conservative therapy with max dose Tylenol 1g TID then PRN in future, can take NSAID PRN 2. New rx start Diclofenac topical gel 1% 2-3 times daily, may require PA 3. RICE therapy (rest, ice, compression, elevation) for swelling, activity modification 4. Defer repeat x-ray today 5. Advised not indicated for chronic opiates hydrocodone 6. Follow-up 3 months if still worsening, consider steroid injection and referral back to [facility] vs [facility] more locally for further eval, may need other imaging
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
## 2017-04-10 ENCOUNTER — Ambulatory Visit (INDEPENDENT_AMBULATORY_CARE_PROVIDER_SITE_OTHER): Payer: 59 | Admitting: Family Medicine

## 2017-04-10 ENCOUNTER — Encounter: Payer: Self-pay | Admitting: Family Medicine

## 2017-04-10 VITALS — BP 138/84 | HR 61 | Temp 98.2°F | Resp 16 | Ht 70.0 in | Wt 191.0 lb

## 2017-04-10 DIAGNOSIS — K219 Gastro-esophageal reflux disease without esophagitis: Secondary | ICD-10-CM | POA: Diagnosis not present

## 2017-04-10 DIAGNOSIS — R7303 Prediabetes: Secondary | ICD-10-CM

## 2017-04-10 DIAGNOSIS — R030 Elevated blood-pressure reading, without diagnosis of hypertension: Secondary | ICD-10-CM | POA: Diagnosis not present

## 2017-04-10 DIAGNOSIS — Z23 Encounter for immunization: Secondary | ICD-10-CM

## 2017-04-10 DIAGNOSIS — M1731 Unilateral post-traumatic osteoarthritis, right knee: Secondary | ICD-10-CM | POA: Diagnosis not present

## 2017-04-10 LAB — POCT GLYCOSYLATED HEMOGLOBIN (HGB A1C): Hemoglobin A1C: 6.1 — AB (ref ?–5.7)

## 2017-04-10 MED ORDER — OMEPRAZOLE 20 MG PO CPDR: 20.0000 mg | DELAYED_RELEASE_CAPSULE | Freq: Every day | ORAL | 3 refills | Status: DC

## 2017-04-10 NOTE — Patient Instructions (Addendum)
Thank you for coming to the clinic today.  1. Improved A1c 6.1, this is pre-Diabetes range - will re-check here in 3 months  Sent omeprazole rx to [facility]  For R knee most likely traumatic arthritis from prior fracture  Continue diclofenac topical gel as needed for knee  Recommend to start taking Tylenol Extra Strength 500mg  tabs - take 1 to 2 tabs per dose (max 1000mg ) every 6-8 hours for pain (take regularly, don't skip a dose for next 7 days), max 24 hour daily dose is 6 tablets or 3000mg . In the future you can repeat the same everyday Tylenol course for 1-2 weeks at a time.   Recommend Tylenol instead of ibuprofen  Use knee sleeve as needed.  In future if not improved, we can consider steroid injection into knee, or X-ray or either physical therapy or Orthopedics (back to [facility] or elsewhere)  Please schedule a Follow-up Appointment to: Return in about 4 months (around 08/08/2017) for Pre-DM A1c, R knee arthritis, Pre-HTN.  If you have any other questions or concerns, please feel free to call the clinic or send a message through [place]. You may also schedule an earlier appointment if necessary.  Additionally, you may be receiving a survey about your experience at our clinic within a few days to 1 week by e-mail or mail. We value your feedback.  Nobie Putnam, DO [place]

## 2017-04-10 NOTE — Assessment & Plan Note (Addendum)
Mildly elevated today, improved on re-check, some elevated due to knee pain Outside BP readings normal at work reportedly, do not have available record No known complications   Plan:  1. No new meds today - will defer for now, reviewed risks of untreated HTN if were to make new dx 2. Encourage improved lifestyle - low sodium diet, regular exercise 3. Continue monitor BP outside office, bring readings to next visit, if persistently >140/90 or new symptoms notify office sooner 4. Follow-up 4 months re-check BP

## 2017-04-10 NOTE — Assessment & Plan Note (Signed)
Stable w/o worsening with chronic R knee OA/DJD post traumatic Followed by [facility] 12/2015 (has not returned) - Prior known injury with fracture s/p surgical repair - Prior trials of PT with improvement - Mild improved on topical NSAID diclofenac but otherwise limited conservative therapy - Failed Ibuprofen, Naproxen, Meloxicam (cannot tolerate due to history of GERD)  Plan: 1. Reviewed conservative therapy again and stressed importance of adding max dose Tylenol 1g TID then PRN in future 2. Continue topical Diclofenac topical gel 1% 2-3 times daily as needed for flares 3. RICE therapy (rest, ice, compression, elevation) for swelling, activity modification 4. Reconsider X-rays in future - declined today - Offered steroid joint injection - declined today will reconsider 5. Follow-up 4 months if still worsening, consider steroid injection and referral back to [facility] vs [facility] more locally for further eval, may need other imaging

## 2017-04-10 NOTE — Assessment & Plan Note (Signed)
Stable, controlled GERD on daily PPI Refill sent to [facility] mail order

## 2017-04-10 NOTE — Assessment & Plan Note (Signed)
Well-controlled Pre-DM with A1c 6.1 (improved to stable from 6.2)  Plan:  1. Not on any therapy currently - remain off for now 2. Encourage improved lifestyle - low carb, low sugar diet, reduce portion size, handout given for diet changes glyemic index foods, continue improving regular exercise walking 3. Follow-up 4 months for PreDM A1c - consider metformin if >6.2

## 2017-04-10 NOTE — Progress Notes (Addendum)
Subjective:    Patient ID: Justin Mckee, male    DOB: 1956-01-05, 61 y.o.   MRN: [id number]  Justin Mckee is a 61 y.o. male presenting on 04/10/2017 for Pre-Diabetes; Pre-HTN; and Osteoarthritis   HPI   Pre-Diabetes - Last visit with me 01/07/17, for new diagnosis of pre-diabetes at annual visit, treated with lifestyle / diet education on improving glucose control avoid progression DM no medications, see prior notes for background information. - Interval update with some improved lifestyle changes, see below - Today patient reports no new concerns Meds: never on meds Lifestyle: - Weight gain 3 lbs in 3 months - Diet (Reduced amount of fried foods, still eating some, still eating breads/starches sandwiches, increased water and reduced soda intake) - Exercise (Regular exercise walking 3 x weekly, and stays active at work)  Elevated BP without diagnosis of HTN: - Last visit with me 01/07/17, for same problem, recommended to monitor BP outside office at work, see prior notes for background information. - Interval update with he has had BP checked at work a few times with reported "normal readings" he does not have written record - Today patient reports no new concerns or symptoms Current Meds - None  Diet - see above, caffeine reduced, still eats meats, no salt added  Right Knee OA/DJD post-traumatic - Last visit with me 01/07/17, for same problem, treated with topical rx Diclofenac NSAID since failed/contraindicated other NSAIDs, see prior notes for background information. - Today patient reports some mild improvement with using topical Diclofenac x 2 daily, he is not taking Tylenol unsure why, never started. Has tried some occasional ibuprofen. - Has not returned to Ortho, previously [facility] - He has completed PT in past with improvement - No new injury - Not interested in injection at this time - Admits R knee pain - Denies any worsening swelling, redness, other joint  pain  Additional update - request add [facility] to his pharmacy list. Request 90 day supply sent rx Omeprazole for GERD  Health Maintenance: - Due for Flu Shot, will receive today   Depression screen [facility] 2/9 01/07/2017 10/14/2015  Decreased Interest 0 0  Down, Depressed, Hopeless 0 0  PHQ - 2 Score 0 0    Social History  Substance Use Topics  . Smoking status: Former Smoker    Packs/day: 1.00    Years: 40.00    Types: Cigarettes  . Smokeless tobacco: Former Systems developer    Quit date: 08/28/2015  . Alcohol use 14.4 oz/week    24 Cans of beer per week     Comment:      Review of Systems Per HPI unless specifically indicated above     Objective:    BP 138/84 (BP Location: Right Arm, Cuff Size: Normal)   Pulse 61   Temp 98.2 F (36.8 C) (Oral)   Resp 16   Ht 5\' 10"  (1.778 m)   Wt 191 lb (86.6 kg)   BMI 27.41 kg/m   Wt Readings from Last 3 Encounters:  04/10/17 191 lb (86.6 kg)  01/07/17 188 lb (85.3 kg)  10/08/16 186 lb (84.4 kg)    Physical Exam  Constitutional: He is oriented to person, place, and time. He appears well-developed and well-nourished. No distress.  Well-appearing, comfortable, cooperative  HENT:  Head: Normocephalic and atraumatic.  Mouth/Throat: Oropharynx is clear and moist.  Eyes: Conjunctivae are normal. Right eye exhibits no discharge. Left eye exhibits no discharge.  Neck: Normal range of motion.  Cardiovascular: Normal rate,  regular rhythm, normal heart sounds and intact distal pulses.   No murmur heard. Pulmonary/Chest: Effort normal and breath sounds normal. No respiratory distress. He has no wheezes. He has no rales.  Musculoskeletal: He exhibits no edema.  R knee not re-examined today  Neurological: He is alert and oriented to person, place, and time.  Skin: Skin is warm and dry. No rash noted. He is not diaphoretic. No erythema.  Psychiatric: He has a normal mood and affect. His behavior is normal.  Well groomed, good eye contact, normal speech  and thoughts  Nursing note and vitals reviewed.    Recent Labs  11/29/16 04/10/17 1620  HGBA1C 6.2 6.1*    Results for orders placed or performed in visit on 04/10/17  POCT HgB A1C  Result Value Ref Range   Hemoglobin A1C 6.1 (A) 5.7      Assessment & Plan:   Problem List Items Addressed This Visit    Elevated BP without diagnosis of hypertension    Mildly elevated today, improved on re-check, some elevated due to knee pain Outside BP readings normal at work reportedly, do not have available record No known complications   Plan:  1. No new meds today - will defer for now, reviewed risks of untreated HTN if were to make new dx 2. Encourage improved lifestyle - low sodium diet, regular exercise 3. Continue monitor BP outside office, bring readings to next visit, if persistently >140/90 or new symptoms notify office sooner 4. Follow-up 4 months re-check BP      GERD (gastroesophageal reflux disease)    Stable, controlled GERD on daily PPI Refill sent to [facility] mail order      Relevant Medications   omeprazole (PRILOSEC) 20 MG capsule   Post-traumatic osteoarthritis of right knee    Stable w/o worsening with chronic R knee OA/DJD post traumatic Followed by [facility] 12/2015 (has not returned) - Prior known injury with fracture s/p surgical repair - Prior trials of PT with improvement - Mild improved on topical NSAID diclofenac but otherwise limited conservative therapy - Failed Ibuprofen, Naproxen, Meloxicam (cannot tolerate due to history of GERD)  Plan: 1. Reviewed conservative therapy again and stressed importance of adding max dose Tylenol 1g TID then PRN in future 2. Continue topical Diclofenac topical gel 1% 2-3 times daily as needed for flares 3. RICE therapy (rest, ice, compression, elevation) for swelling, activity modification 4. Reconsider X-rays in future - declined today - Offered steroid joint injection - declined today will reconsider 5. Follow-up 4  months if still worsening, consider steroid injection and referral back to [facility] more locally for further eval, may need other imaging      Pre-diabetes - Primary    Well-controlled Pre-DM with A1c 6.1 (improved to stable from 6.2)  Plan:  1. Not on any therapy currently - remain off for now 2. Encourage improved lifestyle - low carb, low sugar diet, reduce portion size, handout given for diet changes glyemic index foods, continue improving regular exercise walking 3. Follow-up 4 months for PreDM A1c - consider metformin if >6.2      Relevant Orders   POCT HgB A1C (Completed)    Other Visit Diagnoses    Needs flu shot       Relevant Orders   Flu Vaccine QUAD 36+ mos IM (Completed)      Meds ordered this encounter  Medications  . omeprazole (PRILOSEC) 20 MG capsule    Sig: Take 1 capsule (20  mg total) by mouth daily before breakfast. About 30 min before 1st meal of day    Dispense:  90 capsule    Refill:  3    Follow up plan: Return in about 4 months (around 08/08/2017) for Pre-DM A1c, R knee arthritis, Pre-HTN.  Nobie Putnam, [facility] 04/10/2017, 5:30 PM

## 2017-05-07 ENCOUNTER — Other Ambulatory Visit: Payer: Self-pay | Admitting: Family Medicine

## 2017-05-07 DIAGNOSIS — K219 Gastro-esophageal reflux disease without esophagitis: Secondary | ICD-10-CM

## 2017-07-30 ENCOUNTER — Emergency Department: Payer: 59

## 2017-07-30 ENCOUNTER — Other Ambulatory Visit: Payer: Self-pay

## 2017-07-30 ENCOUNTER — Emergency Department
Admission: EM | Admit: 2017-07-30 | Discharge: 2017-07-30 | Disposition: A | Discharge status: Home / Self Care | Payer: 59 | Attending: Student in an Organized Health Care Education/Training Program | Admitting: Student in an Organized Health Care Education/Training Program

## 2017-07-30 ENCOUNTER — Encounter: Payer: Self-pay | Admitting: Emergency Medicine

## 2017-07-30 DIAGNOSIS — J101 Influenza due to other identified influenza virus with other respiratory manifestations: Secondary | ICD-10-CM | POA: Insufficient documentation

## 2017-07-30 DIAGNOSIS — Z79899 Other long term (current) drug therapy: Secondary | ICD-10-CM | POA: Diagnosis not present

## 2017-07-30 DIAGNOSIS — Z8546 Personal history of malignant neoplasm of prostate: Secondary | ICD-10-CM | POA: Insufficient documentation

## 2017-07-30 DIAGNOSIS — Z87891 Personal history of nicotine dependence: Secondary | ICD-10-CM | POA: Diagnosis not present

## 2017-07-30 DIAGNOSIS — R0602 Shortness of breath: Secondary | ICD-10-CM

## 2017-07-30 DIAGNOSIS — R030 Elevated blood-pressure reading, without diagnosis of hypertension: Secondary | ICD-10-CM | POA: Diagnosis not present

## 2017-07-30 DIAGNOSIS — R05 Cough: Secondary | ICD-10-CM | POA: Insufficient documentation

## 2017-07-30 DIAGNOSIS — M791 Myalgia, unspecified site: Secondary | ICD-10-CM | POA: Diagnosis not present

## 2017-07-30 LAB — BASIC METABOLIC PANEL
Anion gap: 8 (ref 5–15)
BUN: 14 mg/dL (ref 6–20)
CALCIUM: 9.4 mg/dL (ref 8.9–10.3)
CHLORIDE: 102 mmol/L (ref 101–111)
CO2: 25 mmol/L (ref 22–32)
CREATININE: 1.14 mg/dL (ref 0.61–1.24)
GFR calc Af Amer: >60 mL/min (ref >60)
GFR calc non Af Amer: >60 mL/min (ref >60)
Glucose, Bld: 141 mg/dL — ABNORMAL HIGH (ref 65–99)
Potassium: 4 mmol/L (ref 3.5–5.1)
SODIUM: 135 mmol/L (ref 135–145)

## 2017-07-30 LAB — CBC
HCT: 40.9 % (ref 40.0–52.0)
Hemoglobin: 13.7 g/dL (ref 13.0–18.0)
MCH: 30.7 pg (ref 26.0–34.0)
MCHC: 33.5 g/dL (ref 32.0–36.0)
MCV: 91.7 fL (ref 80.0–100.0)
PLATELETS: 377 10*3/uL (ref 150–440)
RBC: 4.46 MIL/uL (ref 4.40–5.90)
RDW: 14.5 % (ref 11.5–14.5)
WBC: 10.5 10*3/uL (ref 3.8–10.6)

## 2017-07-30 LAB — LACTIC ACID, PLASMA: Lactic Acid, Venous: 1.4 mmol/L (ref 0.5–1.9)

## 2017-07-30 LAB — INFLUENZA PANEL BY PCR (TYPE A & B)
INFLBPCR: NEGATIVE
Influenza A By PCR: POSITIVE — AB

## 2017-07-30 LAB — TROPONIN I

## 2017-07-30 MED ORDER — PREDNISONE 20 MG PO TABS: 40.0000 mg | ORAL_TABLET | Freq: Every day | ORAL | 0 refills | Status: AC

## 2017-07-30 MED ORDER — IPRATROPIUM-ALBUTEROL 0.5-2.5 (3) MG/3ML IN SOLN
3.0000 mL | Freq: Once | RESPIRATORY_TRACT | Status: AC
  Administered 2017-07-30: 3 mL via RESPIRATORY_TRACT
  Filled 2017-07-30: qty 3

## 2017-07-30 MED ORDER — OSELTAMIVIR PHOSPHATE 75 MG PO CAPS: 75.0000 mg | ORAL_CAPSULE | Freq: Two times a day (BID) | ORAL | 0 refills | Status: AC

## 2017-07-30 MED ORDER — ALBUTEROL SULFATE HFA 108 (90 BASE) MCG/ACT IN AERS: 2.0000 | INHALATION_SPRAY | Freq: Four times a day (QID) | RESPIRATORY_TRACT | 2 refills | Status: DC | PRN

## 2017-07-30 MED ORDER — SODIUM CHLORIDE 0.9 % IV BOLUS (SEPSIS)
1000.0000 mL | Freq: Once | INTRAVENOUS | Status: AC
  Administered 2017-07-30: 1000 mL via INTRAVENOUS

## 2017-07-30 MED ORDER — ACETAMINOPHEN 325 MG PO TABS
ORAL_TABLET | ORAL | Status: AC
  Filled 2017-07-30: qty 1

## 2017-07-30 MED ORDER — IBUPROFEN 600 MG PO TABS
600.0000 mg | ORAL_TABLET | Freq: Once | ORAL | Status: AC
  Administered 2017-07-30: 600 mg via ORAL
  Filled 2017-07-30: qty 1

## 2017-07-30 MED ORDER — PREDNISONE 20 MG PO TABS
60.0000 mg | ORAL_TABLET | Freq: Once | ORAL | Status: AC
  Administered 2017-07-30: 60 mg via ORAL
  Filled 2017-07-30: qty 3

## 2017-07-30 MED ORDER — ACETAMINOPHEN 325 MG PO TABS
650.0000 mg | ORAL_TABLET | Freq: Once | ORAL | Status: AC | PRN
  Administered 2017-07-30: 650 mg via ORAL

## 2017-07-30 MED ORDER — OSELTAMIVIR PHOSPHATE 75 MG PO CAPS
75.0000 mg | ORAL_CAPSULE | Freq: Once | ORAL | Status: AC
  Administered 2017-07-30: 75 mg via ORAL
  Filled 2017-07-30: qty 1

## 2017-07-30 NOTE — Discharge Instructions (Signed)
Return to the ER for worsenign shortness of breath, pain, or for any additional questions or concerns.

## 2017-07-30 NOTE — ED Notes (Signed)
Patient to room 7.  Rhae Lerner RN aware.
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
## 2017-07-30 NOTE — ED Notes (Signed)
Pt ambulated while monitoring oxygen saturation. Oxygen saturation maintained above 91% while ambulating

## 2017-07-30 NOTE — ED Triage Notes (Signed)
Pt here for [facility]. Appears mildly labored after walking to triage.  Reports unable to walk around at home because will feel [facility].  Has a "funny feeling" in chest.  Febrile in triage. Pt reports feels like when had PNA before.

## 2017-07-30 NOTE — ED Notes (Signed)
First Nurse Note:  Patient states "I think I've got pneumonia".  Complaining of cough and SHOB.  Pulse ox - 94% on room air.

## 2017-08-05 ENCOUNTER — Encounter: Payer: Self-pay | Admitting: Family Medicine

## 2017-08-05 ENCOUNTER — Ambulatory Visit (INDEPENDENT_AMBULATORY_CARE_PROVIDER_SITE_OTHER): Payer: 59 | Admitting: Family Medicine

## 2017-08-05 VITALS — BP 124/72 | HR 88 | Temp 98.7°F | Resp 16 | Ht 69.0 in | Wt 189.0 lb

## 2017-08-05 DIAGNOSIS — G9331 Postviral fatigue syndrome: Secondary | ICD-10-CM

## 2017-08-05 DIAGNOSIS — G933 Postviral fatigue syndrome: Secondary | ICD-10-CM

## 2017-08-05 DIAGNOSIS — J101 Influenza due to other identified influenza virus with other respiratory manifestations: Secondary | ICD-10-CM | POA: Diagnosis not present

## 2017-08-05 MED ORDER — AZITHROMYCIN 250 MG PO TABS: ORAL_TABLET | ORAL | 0 refills | Status: DC

## 2017-08-05 MED ORDER — BENZONATATE 100 MG PO CAPS: 100.0000 mg | ORAL_CAPSULE | Freq: Three times a day (TID) | ORAL | 0 refills | Status: DC | PRN

## 2017-08-05 NOTE — Patient Instructions (Addendum)
Thank you for coming to the office today.  1.  You tested positive for Influenza A in ED on 07/30/17 Finished Tamiflu and Prednisone  Still have coughing and lungs sound concerning like a possible pneumonia.  I would recommend starting antibiotic Azithromycin for possible pneumonia and also cough medicine  - For symptom control:      - Take Ibuprofen / Advil 400-600mg  every 6-8 hours as needed for fever / muscle aches, and may also take Tylenol 500-1000mg  per dose every 6-8 hours or 3 times a day, can alternate dosing      - Start Tessalon perls one every 8 hours or 3 times a day as needed for cough      - Start OTC Mucinex-DM for cough and congestion for up to 7 days - Improve hydration with plenty of clear fluids  If significant worsening with poor fluid intake, worsening fever, difficulty breathing due to coughing, worsening body aches, weakness, or other more concerning symptoms difficulty breathing you can seek treatment at Emergency Department. Also if improved flu symptoms and then worsening days to week later with concerns for bronchitis, productive cough fever chills again we may need to check for possible pneumonia that can occur after the flu   Please schedule a Follow-up Appointment to: Return in about 1 week (around 08/12/2017) for re-schedule PreDM A1c, PreHTN, R Knee pain, recent flu.    If you have any other questions or concerns, please feel free to call the office or send a message through [facility]. You may also schedule an earlier appointment if necessary.  Additionally, you may be receiving a survey about your experience at our office within a few days to 1 week by e-mail or mail. We value your feedback.  Nobie Putnam, DO [place]

## 2017-08-05 NOTE — Progress Notes (Signed)
Subjective:    Patient ID: Justin Mckee, male    DOB: 11/22/1955, 62 y.o.   MRN: [id number]  Justin Mckee is a 62 y.o. male presenting on 08/05/2017 for Cough (bodyache, chest congestion, 102 F fever went to ER diagnosed with Flu A)  Patient presents for a same day appointment.  HPI    ED FOLLOW-UP VISIT  Hospital/Location: [facility] ED Date of ED Visit: 07/30/17  Reason for Presenting to ED: Fever, Short of Breath, Coughing Primary (+Secondary) Diagnosis: Influenza A  FOLLOW-UP  - ED provider note and record have been reviewed - Patient presents today about 6 days after recent ED visit. Brief summary of recent course, patient had symptoms of shortness of breath cough fevers for 24 hours then he presented to ED on 07/30/17, testing in ED with POSITIVE Influenza A PCR, other labs with chemistry, CBC, troponin, lactic acid were normal, EKG and Chest X-ray that did not show any focal pneumonia or other changes, there has been some chronic scarring  treated with Tamiflu and Prednisone. - Today reports overall has improved some after ED discharge, but still complains of persistent coughing, mildly productive, some chest wall rib pain with coughing - he had history of pneumonia before concerned about this with cough - Admits fevers seem improved, still some temperature instability but no chills or documented fever - Admits some dyspnea at times - Denies nausea vomiting, abdominal pain, dyspnea, diarrhea, congestion, ear pain headache  Depression screen [facility] 2/9 01/07/2017 10/14/2015  Decreased Interest 0 0  Down, Depressed, Hopeless 0 0  PHQ - 2 Score 0 0    Social History   Tobacco Use  . Smoking status: Former Smoker    Packs/day: 1.00    Years: 40.00    Pack years: 40.00    Types: Cigarettes  . Smokeless tobacco: Former Systems developer    Quit date: 08/28/2015  Substance Use Topics  . Alcohol use: Yes    Alcohol/week: 14.4 oz    Types: 24 Cans of beer per week    Comment:    . Drug use: No     Review of Systems Per HPI unless specifically indicated above     Objective:    BP 124/72   Pulse 88   Temp 98.7 F (37.1 C) (Oral)   Resp 16   Ht 5\' 9"  (1.753 m)   Wt 189 lb (85.7 kg)   SpO2 97%   BMI 27.91 kg/m   Wt Readings from Last 3 Encounters:  08/05/17 189 lb (85.7 kg)  07/30/17 190 lb (86.2 kg)  04/10/17 191 lb (86.6 kg)    Physical Exam  Constitutional: He is oriented to person, place, and time. He appears well-developed and well-nourished. No distress.  Mildly ill and tired appearing, seems mostly comfortable, cooperative  HENT:  Head: Normocephalic and atraumatic.  Mouth/Throat: Oropharynx is clear and moist.  Frontal / maxillary sinuses non-tender. Nares patent without purulence or edema. Oropharynx clear without erythema, exudates, edema or asymmetry.  Eyes: Conjunctivae are normal. Right eye exhibits no discharge. Left eye exhibits no discharge.  Neck: Normal range of motion. Neck supple.  Cardiovascular: Normal rate, regular rhythm, normal heart sounds and intact distal pulses.  No murmur heard. Pulmonary/Chest: Effort normal. No respiratory distress. He has wheezes. He has rales.  Coarse breath sounds with some wheezing bilateral diffusely, otherwise mostly intact air movement. Speaks full sentences. Occasional coughing.  Musculoskeletal: Normal range of motion. He exhibits no edema.  Lymphadenopathy:    He has  no cervical adenopathy.  Neurological: He is alert and oriented to person, place, and time.  Skin: Skin is warm and dry. No rash noted. He is not diaphoretic. No erythema.  Psychiatric: He has a normal mood and affect. His behavior is normal.  Well groomed, good eye contact, normal speech and thoughts  Nursing note and vitals reviewed.    I have personally reviewed the radiology report from ED 07/30/17 Chest X-ray  CLINICAL DATA:  Shortness of Breath  EXAM: CHEST  2 VIEW  COMPARISON:  11/02/2015  FINDINGS: Pain density at the right  lung base is stable, presumably scarring. No confluent opacity on the left. Heart is upper limits normal in size. No effusions or acute bony abnormality.  IMPRESSION: Right basilar density likely reflects scarring.  No active disease.   Electronically Signed   By: Rolm Baptise M.D.   On: 07/30/2017 08:37  Results for orders placed or performed during the hospital encounter of 80/99/83  Basic metabolic panel  Result Value Ref Range   Sodium 135 135 - 145 mmol/L   Potassium 4.0 3.5 - 5.1 mmol/L   Chloride 102 101 - 111 mmol/L   CO2 25 22 - 32 mmol/L   Glucose, Bld 141 (H) 65 - 99 mg/dL   BUN 14 6 - 20 mg/dL   Creatinine, Ser 1.14 0.61 - 1.24 mg/dL   Calcium 9.4 8.9 - 10.3 mg/dL   GFR calc non Af Amer >60 >60 mL/min   GFR calc Af Amer >60 >60 mL/min   Anion gap 8 5 - 15  CBC  Result Value Ref Range   WBC 10.5 3.8 - 10.6 K/uL   RBC 4.46 4.40 - 5.90 MIL/uL   Hemoglobin 13.7 13.0 - 18.0 g/dL   HCT 40.9 40.0 - 52.0 %   MCV 91.7 80.0 - 100.0 fL   MCH 30.7 26.0 - 34.0 pg   MCHC 33.5 32.0 - 36.0 g/dL   RDW 14.5 11.5 - 14.5 %   Platelets 377 150 - 440 K/uL  Troponin I  Result Value Ref Range   Troponin I <0.03 <0.03 ng/mL  Lactic acid, plasma  Result Value Ref Range   Lactic Acid, Venous 1.4 0.5 - 1.9 mmol/L  Influenza panel by PCR (type A & B)  Result Value Ref Range   Influenza A By PCR POSITIVE (A) NEGATIVE   Influenza B By PCR NEGATIVE NEGATIVE      Assessment & Plan:   Problem List Items Addressed This Visit    None    Visit Diagnoses    Post-influenza syndrome    -  Primary   Relevant Medications   azithromycin (ZITHROMAX Z-PAK) 250 MG tablet   benzonatate (TESSALON) 100 MG capsule   Influenza A       Relevant Medications   azithromycin (ZITHROMAX Z-PAK) 250 MG tablet      Clinically consistent with recent Influenza A and now concern post flu complication, possible pneumonia vs bronchitis. Confirmed Flu A in ED 2/19. Now 6 days later still coughing and  coarse breath sounds, some productive cough, temperature instability Hemodynamically stable. 97% pulse ox on RA  Plan: 1. S/p completed Tamiflu and Prednisone - Start Azithromycin Z-pak antibiotic today empirically - considered repeat CXR since 6 days out will defer - Start Tessalon Perls take 1 capsule up to 3 times a day as needed for cough - Start OTC Mucinex - Continue supportive care - Return criteria given if significant worsening, consider post-influenza complications, otherwise follow-up  if needed - may need chest x-ray next time if worse or not improving.   I have reviewed the discharge medication list, and have reconciled the current and discharge medications today.   Meds ordered this encounter  Medications  . azithromycin (ZITHROMAX Z-PAK) 250 MG tablet    Sig: Take 2 tabs (500mg  total) on Day 1. Take 1 tab (250mg ) daily for next 4 days.    Dispense:  6 tablet    Refill:  0  . benzonatate (TESSALON) 100 MG capsule    Sig: Take 1 capsule (100 mg total) by mouth 3 (three) times daily as needed for cough.    Dispense:  30 capsule    Refill:  0      Follow up plan: Return in about 1 week (around 08/12/2017) for re-schedule PreDM A1c, PreHTN, R Knee pain, recent flu.  RESCHEDULE APT - he was scheduled to see me in 2 days. Now should return in 1 week for other follow-up and we can check respiratory status.  Nobie Putnam, [facility] 08/05/2017, 5:23 PM

## 2017-08-07 ENCOUNTER — Ambulatory Visit: Payer: BLUE CROSS/BLUE SHIELD | Admitting: Family Medicine

## 2017-08-13 ENCOUNTER — Ambulatory Visit (INDEPENDENT_AMBULATORY_CARE_PROVIDER_SITE_OTHER): Payer: 59 | Admitting: Family Medicine

## 2017-08-13 ENCOUNTER — Other Ambulatory Visit: Payer: Self-pay

## 2017-08-13 VITALS — BP 134/83 | HR 73 | Temp 97.9°F | Ht 70.0 in | Wt 190.0 lb

## 2017-08-13 DIAGNOSIS — M1731 Unilateral post-traumatic osteoarthritis, right knee: Secondary | ICD-10-CM

## 2017-08-13 DIAGNOSIS — R7303 Prediabetes: Secondary | ICD-10-CM

## 2017-08-13 NOTE — Progress Notes (Signed)
Subjective:    Patient ID: Justin Mckee, male    DOB: 1956/03/26, 62 y.o.   MRN: [id number]  Justin Mckee is a 62 y.o. male presenting on 08/13/2017 for Diabetes (A1C) and Knee Pain   HPI   Pre-Diabetes - Last visit with me 03/2017, for treated with lifestyle / diet education, had stable A1c, not started on DM medications, see prior notes for background information. - Interval update with recent poor lifestyle adherence due to recent influenza illness and winter weather - Today patient reports no new concerns except not adhering to diet Meds: never on meds Lifestyle: - Weight stable - Diet (Still eating some fried foods, not limiting as many breads/starches as discussed, still inc water intake) - Exercise ( Less regular exercise now, no longer walking as much)  Right Knee OA/DJD post-traumatic - Last visit with me 03/2017, for same problem, treated with continued tylenol and topical rx Diclofenac NSAID, see prior notes for background information. - Previously followed by [facility], last seen 2017, has not had surgery - Now requesting 2nd opinion would like to establish with local ortho, unable to travel to [place] using topical Diclofenac x 2 daily - Limited use of Tylenol - He has completed PT in past with improvement - No new injury - Admits R knee pain - Denies any worsening swelling, redness, other joint pain  Depression screen [facility] 2/9 08/13/2017 01/07/2017 10/14/2015  Decreased Interest 0 0 0  Down, Depressed, Hopeless 0 0 0  PHQ - 2 Score 0 0 0  Altered sleeping 0 - -  Tired, decreased energy 0 - -  Change in appetite 0 - -  Feeling bad or failure about yourself  0 - -  Trouble concentrating 0 - -  Moving slowly or fidgety/restless 0 - -  Suicidal thoughts 0 - -  PHQ-9 Score 0 - -  Difficult doing work/chores Not difficult at all - -    Social History   Tobacco Use  . Smoking status: Former Smoker    Packs/day: 1.00    Years: 40.00    Pack years: 40.00    Types:  Cigarettes  . Smokeless tobacco: Former Systems developer    Quit date: 08/28/2015  Substance Use Topics  . Alcohol use: Yes    Alcohol/week: 14.4 oz    Types: 24 Cans of beer per week    Comment:    . Drug use: No    Review of Systems Per HPI unless specifically indicated above     Objective:    BP 134/83 (BP Location: Left Arm, Patient Position: Sitting, Cuff Size: Normal)   Pulse 73   Temp 97.9 F (36.6 C)   Ht 5\' 10"  (1.778 m)   Wt 190 lb (86.2 kg)   BMI 27.26 kg/m   Wt Readings from Last 3 Encounters:  08/13/17 190 lb (86.2 kg)  08/05/17 189 lb (85.7 kg)  07/30/17 190 lb (86.2 kg)    Physical Exam  Constitutional: He is oriented to person, place, and time. He appears well-developed and well-nourished. No distress.  Well-appearing, comfortable, cooperative  HENT:  Head: Normocephalic and atraumatic.  Mouth/Throat: Oropharynx is clear and moist.  Poor dentition  Eyes: Conjunctivae and EOM are normal. Pupils are equal, round, and reactive to light. Right eye exhibits no discharge. Left eye exhibits no discharge.  Neck: Normal range of motion. Neck supple. No thyromegaly present.  Cardiovascular: Normal rate, regular rhythm, normal heart sounds and intact distal pulses.  No murmur  heard. Pulmonary/Chest: Effort normal and breath sounds normal. No respiratory distress. He has no wheezes. He has no rales.  Abdominal: Soft. Bowel sounds are normal. He exhibits no distension and no mass. There is no tenderness.  Musculoskeletal: Normal range of motion. He exhibits no edema or tenderness.  Upper / Lower Extremities: - Normal muscle tone, strength bilateral upper extremities 5/5, lower extremities 5/5  Right Knee Inspection: Mildly bulky appearance R knee. No ecchymosis or effusion. Palpation: Non-tender today but some area of discomfort medial joint line. Positive crepitus ROM: Full active ROM bilaterally Special Testing: not done today Strength: 5/5 intact knee flex/ext, ankle  dorsi/plantarflex Neurovascular: distally intact sensation light touch and pulses  Lymphadenopathy:    He has no cervical adenopathy.  Neurological: He is alert and oriented to person, place, and time.  Distal sensation intact to light touch all extremities  Skin: Skin is warm and dry. No rash noted. He is not diaphoretic. No erythema.  Psychiatric: He has a normal mood and affect. His behavior is normal.  Well groomed, good eye contact, normal speech and thoughts  Nursing note and vitals reviewed.  Results for orders placed or performed during the hospital encounter of 16/10/96  Basic metabolic panel  Result Value Ref Range   Sodium 135 135 - 145 mmol/L   Potassium 4.0 3.5 - 5.1 mmol/L   Chloride 102 101 - 111 mmol/L   CO2 25 22 - 32 mmol/L   Glucose, Bld 141 (H) 65 - 99 mg/dL   BUN 14 6 - 20 mg/dL   Creatinine, Ser 1.14 0.61 - 1.24 mg/dL   Calcium 9.4 8.9 - 10.3 mg/dL   GFR calc non Af Amer >60 >60 mL/min   GFR calc Af Amer >60 >60 mL/min   Anion gap 8 5 - 15  CBC  Result Value Ref Range   WBC 10.5 3.8 - 10.6 K/uL   RBC 4.46 4.40 - 5.90 MIL/uL   Hemoglobin 13.7 13.0 - 18.0 g/dL   HCT 40.9 40.0 - 52.0 %   MCV 91.7 80.0 - 100.0 fL   MCH 30.7 26.0 - 34.0 pg   MCHC 33.5 32.0 - 36.0 g/dL   RDW 14.5 11.5 - 14.5 %   Platelets 377 150 - 440 K/uL  Troponin I  Result Value Ref Range   Troponin I <0.03 <0.03 ng/mL  Lactic acid, plasma  Result Value Ref Range   Lactic Acid, Venous 1.4 0.5 - 1.9 mmol/L  Influenza panel by PCR (type A & B)  Result Value Ref Range   Influenza A By PCR POSITIVE (A) NEGATIVE   Influenza B By PCR NEGATIVE NEGATIVE      Assessment & Plan:   Problem List Items Addressed This Visit    Post-traumatic osteoarthritis of right knee    Stable w/o worsening with chronic R knee OA/DJD post traumatic Followed by [facility] 12/2015 (has not returned - now limited due to travel) - Prior known injury with fracture s/p surgical repair - Prior trials of PT with  improvement - Mild improved on topical NSAID diclofenac but otherwise limited conservative therapy - Failed Ibuprofen, Naproxen, Meloxicam (cannot tolerate due to history of GERD)  Plan: 1. Proceed with referral to local [facility] for 2nd opinion - he cannot follow-up with [facility] anymore due to travel - Emphasized conservative therapy again and stressed importance of adding max dose Tylenol 1g TID then PRN in future - Continue topical Diclofenac topical gel 1% 2-3 times daily as  needed for flares - RICE therapy (rest, ice, compression, elevation) for swelling, activity modification - Deferred X-ray / injection      Relevant Orders   Ambulatory referral to Orthopedic Surgery   Pre-diabetes - Primary    Concern with significant worsening Pre-DM with A1c elevated up to 6.6 now from 6.1 Recent lifestyle non adherence and illness w/ flu, 1st reading >6.5  Plan:  1. Not on any therapy currently - discussed diagnosis PreDM vs DM and future risks and complications - emphasized need to improve lifestyle, may consider med he has declined metformin at this time 2. Encourage improved lifestyle - low carb, low sugar diet, reduce portion size, handout given for diet changes glyemic index foods, continue improving regular exercise walking 3. Follow-up 3 mo PreDM A1c - if A1c >6.5 concern new dx T2DM, will need meds      Relevant Orders   POCT HgB A1C      No orders of the defined types were placed in this encounter.   Orders Placed This Encounter  Procedures  . Ambulatory referral to Orthopedic Surgery    Referral Priority:   Routine    Referral Type:   Surgical    Referral Reason:   Specialty Services Required    Requested Specialty:   Orthopedic Surgery    Number of Visits Requested:   1  . POCT HgB A1C     Follow up plan: Return in about 3 months (around 11/13/2017) for Pre-DM A1c, follow-up R knee ortho.  Justin Mckee, [facility] 08/14/2017, 12:25 AM

## 2017-08-13 NOTE — Patient Instructions (Addendum)
Thank you for coming to the office today.  A1c 6.6 - this is elevated today  Concern for possible progression to Type 2 Diabetes  Goal to improve diet and reduce starches and sugar and carbs - follow instructions  Try to walk more again with regular exercise  If sugar next time is >6.5 then may consider new diagnosis type 2 diabetes and will need medicines  Referral to [facility] for Knee evaluation - 2nd opinion  [facility] (formerly [facility] Address: [address], [place], [place] [address] Hours:  9AM-5PM Phone: [phone]   Please schedule a Follow-up Appointment to: Return in about 3 months (around 11/13/2017) for Pre-DM A1c, follow-up R knee ortho.   If you have any other questions or concerns, please feel free to call the office or send a message through [place]. You may also schedule an earlier appointment if necessary.  Additionally, you may be receiving a survey about your experience at our office within a few days to 1 week by e-mail or mail. We value your feedback.  Nobie Putnam, DO [place]

## 2017-08-14 LAB — POCT GLYCOSYLATED HEMOGLOBIN (HGB A1C): Hemoglobin A1C: 6.6 — AB (ref ?–5.7)

## 2017-08-14 NOTE — Assessment & Plan Note (Signed)
Stable w/o worsening with chronic R knee OA/DJD post traumatic Followed by [facility] 12/2015 (has not returned - now limited due to travel) - Prior known injury with fracture s/p surgical repair - Prior trials of PT with improvement - Mild improved on topical NSAID diclofenac but otherwise limited conservative therapy - Failed Ibuprofen, Naproxen, Meloxicam (cannot tolerate due to history of GERD)  Plan: 1. Proceed with referral to local [facility] for 2nd opinion - he cannot follow-up with [facility] anymore due to travel - Emphasized conservative therapy again and stressed importance of adding max dose Tylenol 1g TID then PRN in future - Continue topical Diclofenac topical gel 1% 2-3 times daily as needed for flares - RICE therapy (rest, ice, compression, elevation) for swelling, activity modification - Deferred X-ray / injection

## 2017-08-14 NOTE — Assessment & Plan Note (Signed)
Concern with significant worsening Pre-DM with A1c elevated up to 6.6 now from 6.1 Recent lifestyle non adherence and illness w/ flu, 1st reading >6.5  Plan:  1. Not on any therapy currently - discussed diagnosis PreDM vs DM and future risks and complications - emphasized need to improve lifestyle, may consider med he has declined metformin at this time 2. Encourage improved lifestyle - low carb, low sugar diet, reduce portion size, handout given for diet changes glyemic index foods, continue improving regular exercise walking 3. Follow-up 3 mo PreDM A1c - if A1c >6.5 concern new dx T2DM, will need meds

## 2017-11-18 ENCOUNTER — Ambulatory Visit (INDEPENDENT_AMBULATORY_CARE_PROVIDER_SITE_OTHER): Payer: 59 | Admitting: Family Medicine

## 2017-11-18 ENCOUNTER — Encounter: Payer: Self-pay | Admitting: Family Medicine

## 2017-11-18 ENCOUNTER — Other Ambulatory Visit: Payer: Self-pay | Admitting: Family Medicine

## 2017-11-18 VITALS — BP 144/84 | HR 65 | Resp 16 | Ht 70.0 in | Wt 194.0 lb

## 2017-11-18 DIAGNOSIS — R030 Elevated blood-pressure reading, without diagnosis of hypertension: Secondary | ICD-10-CM | POA: Diagnosis not present

## 2017-11-18 DIAGNOSIS — R7303 Prediabetes: Secondary | ICD-10-CM

## 2017-11-18 DIAGNOSIS — Z114 Encounter for screening for human immunodeficiency virus [HIV]: Secondary | ICD-10-CM

## 2017-11-18 DIAGNOSIS — C61 Malignant neoplasm of prostate: Secondary | ICD-10-CM

## 2017-11-18 DIAGNOSIS — Z1159 Encounter for screening for other viral diseases: Secondary | ICD-10-CM

## 2017-11-18 DIAGNOSIS — Z0000 Encounter for general adult medical examination without abnormal findings: Secondary | ICD-10-CM

## 2017-11-18 DIAGNOSIS — E7800 Pure hypercholesterolemia, unspecified: Secondary | ICD-10-CM

## 2017-11-18 LAB — POCT GLYCOSYLATED HEMOGLOBIN (HGB A1C): HEMOGLOBIN A1C: 6.5 % — AB (ref 4.0–5.6)

## 2017-11-18 NOTE — Progress Notes (Signed)
Subjective:    Patient ID: Justin Mckee, male    DOB: 1955-09-10, 62 y.o.   MRN: [id number]  Justin Mckee is a 62 y.o. male presenting on 11/18/2017 for Knee Pain (F/U : R knee still hurts but declining ortho appt. ) and Blood Sugar Problem (F/U: A1C Pre DM )   HPI   Pre-Diabetes: - Last visit with me 08/2017, with elevated A1c up to 6.6, emphasis on lifestyle, see prior notes for background information. - Today patient reportsno new concerns Meds: never on meds Lifestyle: - Weight gain +4 lbs in 3 months - Diet (Not limiting diet as much, some better choices, drinks some water) - Exercise ( Less regular exercise, he does ride bicycle and keeps walking) Denies hypoglycemia, polyuria, visual changes, numbness or tingling.  Elevated BP without dx HTN Reports no prior dx HTN. Outside readings by nurse at work are often near normal range, not sure of reading Current Meds - Never on med   Denies CP, dyspnea, HA, edema, dizziness / lightheadedness   Depression screen [facility] 2/9 11/18/2017 08/13/2017 01/07/2017  Decreased Interest 0 0 0  Down, Depressed, Hopeless 0 0 0  PHQ - 2 Score 0 0 0  Altered sleeping 2 0 -  Tired, decreased energy 0 0 -  Change in appetite 0 0 -  Feeling bad or failure about yourself  0 0 -  Trouble concentrating 0 0 -  Moving slowly or fidgety/restless 0 0 -  Suicidal thoughts 0 0 -  PHQ-9 Score 2 0 -  Difficult doing work/chores Not difficult at all Not difficult at all -    Social History   Tobacco Use  . Smoking status: Former Smoker    Packs/day: 1.00    Years: 40.00    Pack years: 40.00    Types: Cigarettes  . Smokeless tobacco: Former Systems developer    Quit date: 08/28/2015  Substance Use Topics  . Alcohol use: Yes    Alcohol/week: 14.4 oz    Types: 24 Cans of beer per week    Comment:    . Drug use: No    Review of Systems Per HPI unless specifically indicated above     Objective:    BP (!) 144/84 (BP Location: Left Arm, Cuff Size: Normal)   Pulse  65   Resp 16   Ht 5\' 10"  (1.778 m)   Wt 194 lb (88 kg)   SpO2 99%   BMI 27.84 kg/m   Wt Readings from Last 3 Encounters:  11/18/17 194 lb (88 kg)  08/13/17 190 lb (86.2 kg)  08/05/17 189 lb (85.7 kg)    Physical Exam  Constitutional: He is oriented to person, place, and time. He appears well-developed and well-nourished. No distress.  Well-appearing, comfortable, cooperative  HENT:  Head: Normocephalic and atraumatic.  Mouth/Throat: Oropharynx is clear and moist.  Eyes: Conjunctivae are normal. Right eye exhibits no discharge. Left eye exhibits no discharge.  Cardiovascular: Normal rate.  Pulmonary/Chest: Effort normal.  Musculoskeletal: He exhibits no edema.  Neurological: He is alert and oriented to person, place, and time.  Skin: Skin is warm and dry. No rash noted. He is not diaphoretic. No erythema.  Psychiatric: He has a normal mood and affect. His behavior is normal.  Well groomed, good eye contact, normal speech and thoughts  Nursing note and vitals reviewed.  Results for orders placed or performed in visit on 11/18/17  POCT glycosylated hemoglobin (Hb A1C)  Result Value Ref Range   Hemoglobin  A1C 6.5 (A) 4.0 - 5.6 %   HbA1c, POC (prediabetic range)  5.7 - 6.4 %   HbA1c, POC (controlled diabetic range)  0.0 - 7.0 %      Assessment & Plan:   Problem List Items Addressed This Visit    Elevated BP without diagnosis of hypertension    Mildly elevated today, persistent Outside BP readings normal at work reportedly, do not have available record No known complications    Plan:  1. No new meds today - will defer again for now, reviewed risks of untreated HTN if were to make new dx 2. Encourage improved lifestyle - low sodium diet, regular exercise 3. Continue monitor BP outside office, bring readings to next visit, if persistently >140/90 or new symptoms notify office sooner 4. Follow-up 3 months annual + labs - agree to start anti-HTN med if still elevated, likely  check microalbumin and consider ACEi/ARB if need      Pre-diabetes - Primary    Improved control A1c now down to 6.2 from prior 6.6, however still limited lifestyle interventions At risk of new T2DM diagnosis in future if elevated A1c   Plan:  1. Not on any therapy currently - offered metformin, deferred today 2. Encourage improved lifestyle - low carb, low sugar diet, reduce portion size, continue improving regular exercise - He will agree to see Nutritonist at work 3. Follow-up 3 months for Annual Phys + labs including A1c       Relevant Orders   POCT glycosylated hemoglobin (Hb A1C) (Completed)      No orders of the defined types were placed in this encounter.   Follow up plan: Return in about 3 months (around 02/18/2018) for Annual Physical.  Future labs ordered for 02/12/18  Nobie Putnam, [facility] 11/19/2017, 1:10 AM

## 2017-11-18 NOTE — Addendum Note (Signed)
Addended by: Olin Hauser on: 11/18/2017 11:51 PM   Modules accepted: Orders

## 2017-11-18 NOTE — Patient Instructions (Addendum)
Thank you for coming to the office today.  Recommend talking to nutritionist at work to learn more about elevated blood sugar at risk of diabetes  Last time A1c 6.6  TODAY A1c 6.2 - not in range of Diabetes - but this is Pre-Diabetes  Keep track of Blood Pressure outside office  No medicine.  DUE for FASTING BLOOD WORK (no food or drink after midnight before the lab appointment, only water or coffee without cream/sugar on the morning of)  SCHEDULE "Lab Only" visit in the morning at the clinic for lab draw in 3 MONTHS   - Make sure Lab Only appointment is at about 1 week before your next appointment, so that results will be available  For Lab Results, once available within 2-3 days of blood draw, you can can log in to MyChart online to view your results and a brief explanation. Also, we can discuss results at next follow-up visit.   Please schedule a Follow-up Appointment to: Return in about 3 months (around 02/18/2018) for Annual Physical.  If you have any other questions or concerns, please feel free to call the office or send a message through [place]. You may also schedule an earlier appointment if necessary.  Additionally, you may be receiving a survey about your experience at our office within a few days to 1 week by e-mail or mail. We value your feedback.  Nobie Putnam, DO [place]

## 2017-11-19 NOTE — Assessment & Plan Note (Signed)
Mildly elevated today, persistent Outside BP readings normal at work reportedly, do not have available record No known complications    Plan:  1. No new meds today - will defer again for now, reviewed risks of untreated HTN if were to make new dx 2. Encourage improved lifestyle - low sodium diet, regular exercise 3. Continue monitor BP outside office, bring readings to next visit, if persistently >140/90 or new symptoms notify office sooner 4. Follow-up 3 months annual + labs - agree to start anti-HTN med if still elevated, likely check microalbumin and consider ACEi/ARB if need

## 2017-11-19 NOTE — Assessment & Plan Note (Addendum)
Improved control A1c now down to 6.2 from prior 6.6, however still limited lifestyle interventions At risk of new T2DM diagnosis in future if elevated A1c   Plan:  1. Not on any therapy currently - offered metformin, deferred today 2. Encourage improved lifestyle - low carb, low sugar diet, reduce portion size, continue improving regular exercise - Justin Mckee will agree to see Nutritonist at work 3. Follow-up 3 months for Annual Phys + labs including A1c

## 2018-01-16 ENCOUNTER — Ambulatory Visit (INDEPENDENT_AMBULATORY_CARE_PROVIDER_SITE_OTHER): Payer: 59 | Admitting: Family Medicine

## 2018-01-16 ENCOUNTER — Other Ambulatory Visit: Payer: Self-pay | Admitting: Family Medicine

## 2018-01-16 ENCOUNTER — Encounter: Payer: Self-pay | Admitting: Family Medicine

## 2018-01-16 ENCOUNTER — Telehealth: Payer: Self-pay | Admitting: Family Medicine

## 2018-01-16 VITALS — BP 137/87 | HR 69 | Temp 98.0°F | Resp 16 | Ht 70.0 in | Wt 191.0 lb

## 2018-01-16 DIAGNOSIS — N509 Disorder of male genital organs, unspecified: Secondary | ICD-10-CM

## 2018-01-16 DIAGNOSIS — N5089 Other specified disorders of the male genital organs: Secondary | ICD-10-CM

## 2018-01-16 DIAGNOSIS — N50812 Left testicular pain: Secondary | ICD-10-CM | POA: Diagnosis not present

## 2018-01-16 NOTE — Progress Notes (Addendum)
Subjective:    Patient ID: Justin Mckee, male    DOB: 04/28/1956, 62 y.o.   MRN: [id number]  Justin Mckee is a 62 y.o. male presenting on 01/16/2018 for Testicle Pain (onset 2 days as per patient felt lump on Left side)   HPI   Testicular Pain, Left sided Reports onset 2 days ago while at work, he felt "discomfort" and he said he checked testicular region and found a "lump". Never had testicular pain or a "lump" before. He states pain is intermittent, not constant, seems to be provoked at times by turning the wrong way has noticed while laying down and resting. - Denies any urinary frequency, dysuria, fevers, chills, swelling redness  Depression screen [facility] 2/9 01/16/2018 11/18/2017 08/13/2017  Decreased Interest 0 0 0  Down, Depressed, Hopeless 0 0 0  PHQ - 2 Score 0 0 0  Altered sleeping 0 2 0  Tired, decreased energy 0 0 0  Change in appetite 0 0 0  Feeling bad or failure about yourself  0 0 0  Trouble concentrating 0 0 0  Moving slowly or fidgety/restless 0 0 0  Suicidal thoughts 0 0 0  PHQ-9 Score 0 2 0  Difficult doing work/chores Not difficult at all Not difficult at all Not difficult at all    Social History   Tobacco Use  . Smoking status: Former Smoker    Packs/day: 1.00    Years: 40.00    Pack years: 40.00    Types: Cigarettes  . Smokeless tobacco: Former Systems developer    Quit date: 08/28/2015  Substance Use Topics  . Alcohol use: Yes    Alcohol/week: 24.0 standard drinks    Types: 24 Cans of beer per week    Comment:    . Drug use: No    Review of Systems Per HPI unless specifically indicated above     Objective:    BP 137/87   Pulse 69   Temp 98 F (36.7 C) (Oral)   Resp 16   Ht 5\' 10"  (1.778 m)   Wt 191 lb (86.6 kg)   BMI 27.41 kg/m   Wt Readings from Last 3 Encounters:  01/16/18 191 lb (86.6 kg)  11/18/17 194 lb (88 kg)  08/13/17 190 lb (86.2 kg)    Physical Exam  Constitutional: He is oriented to person, place, and time. He appears well-developed and  well-nourished. No distress.  Well-appearing, comfortable, cooperative  HENT:  Head: Normocephalic and atraumatic.  Mouth/Throat: Oropharynx is clear and moist.  Eyes: Conjunctivae are normal. Right eye exhibits no discharge. Left eye exhibits no discharge.  Cardiovascular: Normal rate.  Pulmonary/Chest: Effort normal.  Genitourinary:  Genitourinary Comments: Scrotum appears symmetrical and normal. No hydrocele, erythema, swelling. Both testes are palpated symmetrical. Localized tender palpable mass inferior aspect of L teste vs possible varicocele - difficult to discern if part of testicle or not, seems to be more likely vascular.  No epididymis tenderness or swelling. No localized LAD. Penis is normal without problem.  Musculoskeletal: He exhibits no edema.  Neurological: He is alert and oriented to person, place, and time.  Skin: Skin is warm and dry. No rash noted. He is not diaphoretic. No erythema.  Psychiatric: He has a normal mood and affect. His behavior is normal.  Well groomed, good eye contact, normal speech and thoughts  Nursing note and vitals reviewed.     Assessment & Plan:   Problem List Items Addressed This Visit    None  Visit Diagnoses    Left testicular pain    -  Primary   Relevant Orders   US SCROTUM W/DOPPLER   Scrotal mass       Relevant Orders   US SCROTUM W/DOPPLER      Clinically very localized tender reproducible testicular pain/mass, seems most likely varicocele by exam, but cannot 100% confirm, some question if area of testicle is abnormal and tender as well, inferior aspect. - Admits occasional L flank or groin pain - No other concerning features, seems less likely to be testicular cancer based on history and exam  Plan Will order Scrotum US w/ doppler at [facility] - to be scheduled, and follow-up results with patient, as per AVS, if abnormal or symptoms still persistent then will refer to [facility] for 2nd opinion. If normal and symptoms resolve  within 1-2 weeks then will hold referral and provide reassurance.  Orders Placed This Encounter  Procedures  . US SCROTUM W/DOPPLER    Standing Status:   Future    Standing Expiration Date:   03/19/2019    Order Specific Question:   Reason for Exam (SYMPTOM  OR DIAGNOSIS REQUIRED)    Answer:   Left testicular pain and palpable mass - question if associated with testicle or lower blood vessels possible small varicocele    Order Specific Question:   Preferred imaging location?    Answer:   [facility]    No orders of the defined types were placed in this encounter.   Follow up plan: Return in about 4 weeks (around 02/13/2018), or if symptoms worsen or fail to improve, for scrotum pain/main.  Nobie Putnam, [facility] 01/16/2018, 11:55 AM

## 2018-01-16 NOTE — Addendum Note (Signed)
Addended by: Olin Hauser on: 01/16/2018 11:55 AM   Modules accepted: Orders

## 2018-01-16 NOTE — Telephone Encounter (Signed)
Patient was notified for his ultrasound appointment on 01/17/2018 at 2:00 pm at [facility].

## 2018-01-16 NOTE — Patient Instructions (Addendum)
Thank you for coming to the office today.  I think your testicular pain and lump are less likely to be a cancer. This may be related to the blood vessel in the scrotum. But we cannot be 100% sure unless we do more testing.  Scrotum Ultrasound ordered for [facility] - they will call you with apt  We will call you with results. If it is abnormal then we will refer you to Urology specialist for 2nd opinion.  - If result is NORMAL and you still have pain and feel lump - then we will also refer you for 2nd opinion  - If result is NORMAL and your pain is gone - then we will hold off on referral  [facility] -[address] floor [place],  [place]  [address] Phone: [phone]  Please schedule a Follow-up Appointment to: Return in about 4 weeks (around 02/13/2018), or if symptoms worsen or fail to improve, for scrotum pain/main.  If you have any other questions or concerns, please feel free to call the office or send a message through [facility]. You may also schedule an earlier appointment if necessary.  Additionally, you may be receiving a survey about your experience at our office within a few days to 1 week by e-mail or mail. We value your feedback.  Nobie Putnam, DO [place]

## 2018-01-17 ENCOUNTER — Ambulatory Visit
Admission: RE | Admit: 2018-01-17 | Discharge: 2018-01-17 | Disposition: A | Discharge status: Home / Self Care | Payer: 59 | Source: Ambulatory Visit | Attending: Family Medicine | Admitting: Family Medicine

## 2018-01-17 DIAGNOSIS — N50812 Left testicular pain: Secondary | ICD-10-CM | POA: Diagnosis not present

## 2018-01-17 DIAGNOSIS — N5089 Other specified disorders of the male genital organs: Secondary | ICD-10-CM

## 2018-01-17 DIAGNOSIS — N433 Hydrocele, unspecified: Secondary | ICD-10-CM | POA: Insufficient documentation

## 2018-01-17 DIAGNOSIS — N503 Cyst of epididymis: Secondary | ICD-10-CM | POA: Diagnosis not present

## 2018-01-17 DIAGNOSIS — N509 Disorder of male genital organs, unspecified: Secondary | ICD-10-CM | POA: Diagnosis not present

## 2018-02-12 ENCOUNTER — Other Ambulatory Visit: Payer: 59

## 2018-02-12 DIAGNOSIS — R030 Elevated blood-pressure reading, without diagnosis of hypertension: Secondary | ICD-10-CM

## 2018-02-12 DIAGNOSIS — Z1159 Encounter for screening for other viral diseases: Secondary | ICD-10-CM

## 2018-02-12 DIAGNOSIS — R7303 Prediabetes: Secondary | ICD-10-CM

## 2018-02-12 DIAGNOSIS — Z0000 Encounter for general adult medical examination without abnormal findings: Secondary | ICD-10-CM

## 2018-02-12 DIAGNOSIS — E7800 Pure hypercholesterolemia, unspecified: Secondary | ICD-10-CM

## 2018-02-12 DIAGNOSIS — C61 Malignant neoplasm of prostate: Secondary | ICD-10-CM

## 2018-02-12 DIAGNOSIS — Z114 Encounter for screening for human immunodeficiency virus [HIV]: Secondary | ICD-10-CM

## 2018-02-13 LAB — HEPATITIS C ANTIBODY
Hepatitis C Ab: NONREACTIVE
SIGNAL TO CUT-OFF: 0.02 (ref <1.00)

## 2018-02-13 LAB — CBC WITH DIFFERENTIAL/PLATELET
BASOS ABS: 62 {cells}/uL (ref 0–200)
Basophils Relative: 1.1 %
EOS PCT: 1.4 %
Eosinophils Absolute: 78 cells/uL (ref 15–500)
HEMATOCRIT: 37.6 % — AB (ref 38.5–50.0)
Hemoglobin: 13.1 g/dL — ABNORMAL LOW (ref 13.2–17.1)
LYMPHS ABS: 2279 {cells}/uL (ref 850–3900)
MCH: 30.9 pg (ref 27.0–33.0)
MCHC: 34.8 g/dL (ref 32.0–36.0)
MCV: 88.7 fL (ref 80.0–100.0)
MPV: 10.4 fL (ref 7.5–12.5)
Monocytes Relative: 12.1 %
NEUTROS PCT: 44.7 %
Neutro Abs: 2503 cells/uL (ref 1500–7800)
Platelets: 386 10*3/uL (ref 140–400)
RBC: 4.24 10*6/uL (ref 4.20–5.80)
RDW: 14 % (ref 11.0–15.0)
Total Lymphocyte: 40.7 %
WBC mixed population: 678 cells/uL (ref 200–950)
WBC: 5.6 10*3/uL (ref 3.8–10.8)

## 2018-02-13 LAB — COMPLETE METABOLIC PANEL WITH GFR
AG RATIO: 1.5 (calc) (ref 1.0–2.5)
ALKALINE PHOSPHATASE (APISO): 36 U/L — AB (ref 40–115)
ALT: 19 U/L (ref 9–46)
AST: 18 U/L (ref 10–35)
Albumin: 4.5 g/dL (ref 3.6–5.1)
BILIRUBIN TOTAL: 1.1 mg/dL (ref 0.2–1.2)
BUN: 15 mg/dL (ref 7–25)
CHLORIDE: 102 mmol/L (ref 98–110)
CO2: 27 mmol/L (ref 20–32)
Calcium: 9.7 mg/dL (ref 8.6–10.3)
Creat: 1.08 mg/dL (ref 0.70–1.25)
GFR, Est African American: 85 mL/min/{1.73_m2} (ref >=60)
GFR, Est Non African American: 73 mL/min/{1.73_m2} (ref >=60)
GLOBULIN: 3 g/dL (ref 1.9–3.7)
Glucose, Bld: 107 mg/dL — ABNORMAL HIGH (ref 65–99)
POTASSIUM: 4.8 mmol/L (ref 3.5–5.3)
SODIUM: 136 mmol/L (ref 135–146)
Total Protein: 7.5 g/dL (ref 6.1–8.1)

## 2018-02-13 LAB — LIPID PANEL
CHOLESTEROL: 206 mg/dL — AB (ref <200)
HDL: 49 mg/dL (ref >40)
LDL Cholesterol (Calc): 138 mg/dL (calc) — ABNORMAL HIGH
Non-HDL Cholesterol (Calc): 157 mg/dL (calc) — ABNORMAL HIGH (ref <130)
Total CHOL/HDL Ratio: 4.2 (calc) (ref <5.0)
Triglycerides: 93 mg/dL (ref <150)

## 2018-02-13 LAB — HEMOGLOBIN A1C
HEMOGLOBIN A1C: 6.1 %{Hb} — AB (ref <5.7)
Mean Plasma Glucose: 128 (calc)
eAG (mmol/L): 7.1 (calc)

## 2018-02-13 LAB — HIV ANTIBODY (ROUTINE TESTING W REFLEX): HIV: NONREACTIVE

## 2018-02-13 LAB — PSA, TOTAL WITH REFLEX TO PSA, FREE: PSA, TOTAL: 0.1 ng/mL (ref <=4.0)

## 2018-02-19 ENCOUNTER — Ambulatory Visit (INDEPENDENT_AMBULATORY_CARE_PROVIDER_SITE_OTHER): Payer: 59 | Admitting: Family Medicine

## 2018-02-19 ENCOUNTER — Encounter: Payer: Self-pay | Admitting: Family Medicine

## 2018-02-19 ENCOUNTER — Encounter: Payer: 59 | Admitting: Family Medicine

## 2018-02-19 VITALS — BP 130/80 | HR 72 | Temp 98.4°F | Resp 16 | Ht 70.0 in | Wt 191.0 lb

## 2018-02-19 DIAGNOSIS — K219 Gastro-esophageal reflux disease without esophagitis: Secondary | ICD-10-CM

## 2018-02-19 DIAGNOSIS — R030 Elevated blood-pressure reading, without diagnosis of hypertension: Secondary | ICD-10-CM

## 2018-02-19 DIAGNOSIS — Z0000 Encounter for general adult medical examination without abnormal findings: Secondary | ICD-10-CM | POA: Diagnosis not present

## 2018-02-19 DIAGNOSIS — R7303 Prediabetes: Secondary | ICD-10-CM

## 2018-02-19 DIAGNOSIS — E7800 Pure hypercholesterolemia, unspecified: Secondary | ICD-10-CM

## 2018-02-19 DIAGNOSIS — M1731 Unilateral post-traumatic osteoarthritis, right knee: Secondary | ICD-10-CM

## 2018-02-19 DIAGNOSIS — Z8546 Personal history of malignant neoplasm of prostate: Secondary | ICD-10-CM

## 2018-02-19 MED ORDER — DICLOFENAC SODIUM 1 % TD GEL: 2.0000 g | Freq: Two times a day (BID) | TRANSDERMAL | 3 refills | Status: DC
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
## 2018-02-19 NOTE — Patient Instructions (Addendum)
Thank you for coming to the office today.  Please schedule and return for a NURSE ONLY VISIT for VACCINE - whenever you are ready - Need Flu Vaccine  Ordered the Diclofenac gel for your knee to [facility]  1. Chemistry - Mostly Normal results, including electrolytes, kidney and liver function. Slightly elevated fasting blood sugar   2. Hemoglobin A1c (PreDiabetes) - 6.1, significantly improved from last 6.5 to 6.6, still in range of Pre-Diabetes (>5.7 to 6.4). Less at risk for Type 2 DM at this time.  3. Routine screening HIV and Hepatitis C - Both Negative.  4. PSA Prostate Cancer Screening - 0.1, negative.  5. Cholesterol - Normal 49 HDL (good cholesterol), Mild elevated 138 LDL (bad cholesterol), and normal 93 Triglycerides. Based on cardiovascular risk score calculation, he may benefit from starting Statin Cholesterol med for prevention of ASCVD. We can review further in the office.  6. CBC Blood Counts - Mostly Normal, borderline low Hemoglobin for anemia 13.1, previously 12.9 to 13.7, other abnormality  Please schedule a Follow-up Appointment to: Return in about 6 months (around 08/20/2018) for PreDM A1c, BP check, Knee pain.  If you have any other questions or concerns, please feel free to call the office or send a message through [place]. You may also schedule an earlier appointment if necessary.  Additionally, you may be receiving a survey about your experience at our office within a few days to 1 week by e-mail or mail. We value your feedback.  Nobie Putnam, DO [facility], [facility]    Heart-Healthy Eating Plan Heart-healthy meal planning includes:  Limiting unhealthy fats.  Increasing healthy fats.  Making other small dietary changes.  You may need to talk with your doctor or a diet specialist (dietitian) to create an eating plan that is right for you. What types of fat should I choose?  Choose healthy fats. These include olive oil and canola oil,  flaxseeds, walnuts, almonds, and seeds.  Eat more omega-3 fats. These include salmon, mackerel, sardines, tuna, flaxseed oil, and ground flaxseeds. Try to eat fish at least twice each week.  Limit saturated fats. ? Saturated fats are often found in animal products, such as meats, butter, and cream. ? Plant sources of saturated fats include palm oil, palm kernel oil, and coconut oil.  Avoid foods with partially hydrogenated oils in them. These include stick margarine, some tub margarines, cookies, crackers, and other baked goods. These contain trans fats. What general guidelines do I need to follow?  Check food labels carefully. Identify foods with trans fats or high amounts of saturated fat.  Fill one half of your plate with vegetables and green salads. Eat 4-5 servings of vegetables per day. A serving of vegetables is: ? 1 cup of raw leafy vegetables. ?  cup of raw or cooked cut-up vegetables. ?  cup of vegetable juice.  Fill one fourth of your plate with whole grains. Look for the word "whole" as the first word in the ingredient list.  Fill one fourth of your plate with lean protein foods.  Eat 4-5 servings of fruit per day. A serving of fruit is: ? One medium whole fruit. ?  cup of dried fruit. ?  cup of fresh, frozen, or canned fruit. ?  cup of 100% fruit juice.  Eat more foods that contain soluble fiber. These include apples, broccoli, carrots, beans, peas, and barley. Try to get 20-30 g of fiber per day.  Eat more home-cooked food. Eat less restaurant, buffet,  and fast food.  Limit or avoid alcohol.  Limit foods high in starch and sugar.  Avoid fried foods.  Avoid frying your food. Try baking, boiling, grilling, or broiling it instead. You can also reduce fat by: ? Removing the skin from poultry. ? Removing all visible fats from meats. ? Skimming the fat off of stews, soups, and gravies before serving them. ? Steaming vegetables in water or broth.  Lose weight  if you are overweight.  Eat 4-5 servings of nuts, legumes, and seeds per week: ? One serving of dried beans or legumes equals  cup after being cooked. ? One serving of nuts equals 1 ounces. ? One serving of seeds equals  ounce or one tablespoon.  You may need to keep track of how much salt or sodium you eat. This is especially true if you have high blood pressure. Talk with your doctor or dietitian to get more information. What foods can I eat? Grains Breads, including [place], white, pita, wheat, raisin, rye, oatmeal, and [place]. Tortillas that are neither fried nor made with lard or trans fat. Low-fat rolls, including hotdog and hamburger buns and English muffins. Biscuits. Muffins. Waffles. Pancakes. Light popcorn. Whole-grain cereals. Flatbread. Melba toast. Pretzels. Breadsticks. Rusks. Low-fat snacks. Low-fat crackers, including oyster, saltine, matzo, graham, animal, and rye. Rice and pasta, including brown rice and pastas that are made with whole wheat. Vegetables All vegetables. Fruits All fruits, but limit coconut. Meats and Other Protein Sources Lean, well-trimmed beef, veal, pork, and lamb. Chicken and [place] without skin. All fish and shellfish. Wild duck, rabbit, pheasant, and venison. Egg whites or low-cholesterol egg substitutes. Dried beans, peas, lentils, and tofu. Seeds and most nuts. Dairy Low-fat or nonfat cheeses, including ricotta, string, and mozzarella. Skim or 1% milk that is liquid, powdered, or evaporated. Buttermilk that is made with low-fat milk. Nonfat or low-fat yogurt. Beverages Mineral water. Diet carbonated beverages. Sweets and Desserts Sherbets and fruit ices. Honey, jam, marmalade, jelly, and syrups. Meringues and gelatins. Pure sugar candy, such as hard candy, jelly beans, gumdrops, mints, marshmallows, and small amounts of dark chocolate. [employer] Eat all sweets and desserts in moderation. Fats and Oils Nonhydrogenated (trans-free)  margarines. Vegetable oils, including soybean, sesame, sunflower, olive, peanut, safflower, corn, canola, and cottonseed. Salad dressings or mayonnaise made with a vegetable oil. Limit added fats and oils that you use for cooking, baking, salads, and as spreads. Other Cocoa powder. Coffee and tea. All seasonings and condiments. The items listed above may not be a complete list of recommended foods or beverages. Contact your dietitian for more options. What foods are not recommended? Grains Breads that are made with saturated or trans fats, oils, or whole milk. Croissants. Butter rolls. Cheese breads. Sweet rolls. Donuts. Buttered popcorn. Chow mein noodles. High-fat crackers, such as cheese or butter crackers. Meats and Other Protein Sources Fatty meats, such as hotdogs, short ribs, sausage, spareribs, bacon, rib eye roast or steak, and mutton. High-fat deli meats, such as salami and bologna. Caviar. Domestic duck and goose. Organ meats, such as kidney, liver, sweetbreads, and heart. Dairy Cream, sour cream, cream cheese, and creamed cottage cheese. Whole-milk cheeses, including blue (bleu), Monte[place] Jack, [place], [place], American, [place], Swiss, cheddar, [place], and [place]. Whole or 2% milk that is liquid, evaporated, or condensed. Whole buttermilk. Cream sauce or high-fat cheese sauce. Yogurt that is made from whole milk. Beverages Regular sodas and juice drinks with added sugar. Sweets and Desserts Frosting. Pudding. Cookies. Cakes other than angel  food cake. Candy that has milk chocolate or white chocolate, hydrogenated fat, butter, coconut, or unknown ingredients. Buttered syrups. Full-fat ice cream or ice cream drinks. Fats and Oils Gravy that has suet, meat fat, or shortening. Cocoa butter, hydrogenated oils, palm oil, coconut oil, palm kernel oil. These can often be found in baked products, candy, fried foods, nondairy creamers, and whipped toppings. Solid fats and shortenings, including  bacon fat, salt pork, lard, and butter. Nondairy cream substitutes, such as coffee creamers and sour cream substitutes. Salad dressings that are made of unknown oils, cheese, or sour cream. The items listed above may not be a complete list of foods and beverages to avoid. Contact your dietitian for more information. This information is not intended to replace advice given to you by your health care provider. Make sure you discuss any questions you have with your health care provider. Document Released: 11/27/2011 Document Revised: 11/03/2015 Document Reviewed: 11/19/2013 Elsevier Interactive Patient Education  [employer].

## 2018-02-20 NOTE — Assessment & Plan Note (Signed)
Stable without evidence of recurrence Last PSA 0.1 (02/2018) S/p Prostatectomy 2012 [facility] Has not returned to follow-up with Urology, if abnormal PSA or other concern for recurrence will refer back to Uro

## 2018-02-20 NOTE — Assessment & Plan Note (Signed)
Stable w/o worsening with chronic R knee OA/DJD post traumatic Followed by [facility] 12/2015 (has not returned - now limited due to travel) - Prior known injury with fracture s/p surgical repair - Prior trials of PT with improvement - Failed Ibuprofen, Naproxen, Meloxicam (cannot tolerate due to history of GERD)  Plan: 1. Does not seem he has established with [facility], despite referral back in 08/2017 - Refill Diclofenac gel topical PRN - sent to [facility] - Emphasized conservative therapy again and stressed importance of adding max dose Tylenol 1g TID then PRN in future - RICE therapy (rest, ice, compression, elevation) for swelling, activity modification - Deferred X-ray / injection - may reconsider if cannot get into ortho

## 2018-02-20 NOTE — Assessment & Plan Note (Signed)
Mild elevated LDL Last lipid panel 02/2018 Calculated ASCVD 10 yr risk score elevated 9.2%  Plan: 1. Discussed ASCVD risk and statin therapy - he is considering option, we agree to defer for now and improve lifestyle 2. Encourage improved lifestyle - low carb/cholesterol, reduce portion size, continue improving regular exercise Follow-up 6-12 months

## 2018-02-20 NOTE — Assessment & Plan Note (Signed)
Stable, controlled GERD on daily PPI Omeprazole 20mg 

## 2018-02-20 NOTE — Assessment & Plan Note (Signed)
Improved control A1c now down to 6.1 from prior 6.5 Improved lifestyle At risk of new T2DM diagnosis in future if elevated A1c   Plan:  1. Not on any therapy currently - hold off on medicine today 2. Encourage improved lifestyle - low carb, low sugar diet, reduce portion size, continue improving regular exercise  see Nutritonist at work 3. Follow-up 6 months A1c

## 2018-02-20 NOTE — Assessment & Plan Note (Addendum)
Again elevated here. Improved on re-check. Monitor BP at work, if persistent elevation return sooner Follow-up in future - may need microalbumin prior to ACEi/ARB, now reassuring with improved A1c

## 2018-03-05 ENCOUNTER — Other Ambulatory Visit: Payer: Self-pay | Admitting: Family Medicine

## 2018-03-05 DIAGNOSIS — K219 Gastro-esophageal reflux disease without esophagitis: Secondary | ICD-10-CM

## 2018-04-18 ENCOUNTER — Ambulatory Visit (INDEPENDENT_AMBULATORY_CARE_PROVIDER_SITE_OTHER): Payer: 59 | Admitting: Nurse Practitioner

## 2018-04-18 VITALS — BP 140/80 | HR 65 | Temp 97.7°F | Resp 16 | Ht 70.0 in | Wt 196.0 lb

## 2018-04-18 DIAGNOSIS — Z87898 Personal history of other specified conditions: Secondary | ICD-10-CM | POA: Diagnosis not present

## 2018-04-18 DIAGNOSIS — J0100 Acute maxillary sinusitis, unspecified: Secondary | ICD-10-CM | POA: Diagnosis not present

## 2018-04-18 MED ORDER — ALBUTEROL SULFATE HFA 108 (90 BASE) MCG/ACT IN AERS: 2.0000 | INHALATION_SPRAY | Freq: Four times a day (QID) | RESPIRATORY_TRACT | 2 refills | Status: DC | PRN

## 2018-04-18 MED ORDER — AMOXICILLIN-POT CLAVULANATE 875-125 MG PO TABS: 1.0000 | ORAL_TABLET | Freq: Two times a day (BID) | ORAL | 0 refills | Status: AC

## 2018-04-18 NOTE — Progress Notes (Signed)
Subjective:    Patient ID: Justin Mckee, male    DOB: [birth year], 62 y.o.   MRN: [id number]  Justin Mckee is a 62 y.o. male presenting on 04/18/2018 for Sinus Problem (Left ear pain, tooth pain and lump on left side of neck.  for 3 days)   HPI Sinus Pressure Onset of symptoms Tuesday (3 days ago).  Symptoms include left ear pain, lower left mandible pain, swollen lymp node with pain on left side of neck.  Has pain occasionally with swallowing, aching at bedtime.  Less pain/pressure during day. - Left ear - uses sweet oil at night due to ear pain.  Denies any muffled hearing or changes to hearing. - Regular dental care and denies any toothache. - denies fever, chills, sweats, nausea, vomiting, or diarrhea  - He reports no previous URI symptoms  Social History   Tobacco Use  . Smoking status: Former Smoker    Packs/day: 1.00    Years: 40.00    Pack years: 40.00    Types: Cigarettes  . Smokeless tobacco: Former Systems developer    Quit date: 08/28/2015  Substance Use Topics  . Alcohol use: Yes    Alcohol/week: 24.0 standard drinks    Types: 24 Cans of beer per week    Comment:    . Drug use: No    Review of Systems Per HPI unless specifically indicated above     Objective:    BP 140/80 (BP Location: Left Arm, Patient Position: Sitting, Cuff Size: Normal)   Pulse 65   Temp 97.7 F (36.5 C) (Oral)   Resp 16   Ht 5\' 10"  (1.778 m)   Wt 196 lb (88.9 kg)   SpO2 99%   BMI 28.12 kg/m   Wt Readings from Last 3 Encounters:  04/18/18 196 lb (88.9 kg)  02/19/18 191 lb (86.6 kg)  01/16/18 191 lb (86.6 kg)    Physical Exam  Constitutional: He is oriented to person, place, and time. He appears well-developed and well-nourished. No distress.  HENT:  Head: Normocephalic and atraumatic.  Right Ear: Hearing and external ear normal. There is drainage (canal obstructed by hardened cerumen).  Left Ear: Hearing and external ear normal. There is drainage (canal obstructed by hardened cerumen).    Nose: Right sinus exhibits no maxillary sinus tenderness and no frontal sinus tenderness. Left sinus exhibits maxillary sinus tenderness and frontal sinus tenderness (mild).  Mouth/Throat: Uvula is midline, oropharynx is clear and moist and mucous membranes are normal. Tonsils are 1+ on the right. Tonsils are 1+ on the left.  Eyes: Conjunctivae and lids are normal.  Cardiovascular: Normal rate, regular rhythm, S1 normal, S2 normal, normal heart sounds and intact distal pulses.  Pulmonary/Chest: Effort normal and breath sounds normal. No respiratory distress.  Neurological: He is alert and oriented to person, place, and time.  Skin: Skin is warm and dry.  Psychiatric: He has a normal mood and affect. His behavior is normal.  Vitals reviewed.  Results for orders placed or performed in visit on 02/12/18  Hepatitis C antibody  Result Value Ref Range   Hepatitis C Ab NON-REACTIVE NON-REACTI   SIGNAL TO CUT-OFF 0.02 <1.00  Hemoglobin A1c  Result Value Ref Range   Hgb A1c MFr Bld 6.1 (H) <5.7 % of total Hgb   Mean Plasma Glucose 128 (calc)   eAG (mmol/L) 7.1 (calc)  CBC with Differential/Platelet  Result Value Ref Range   WBC 5.6 3.8 - 10.8 Thousand/uL   RBC 4.24 4.20 -  5.80 Million/uL   Hemoglobin 13.1 (L) 13.2 - 17.1 g/dL   HCT 37.6 (L) 38.5 - 50.0 %   MCV 88.7 80.0 - 100.0 fL   MCH 30.9 27.0 - 33.0 pg   MCHC 34.8 32.0 - 36.0 g/dL   RDW 14.0 11.0 - 15.0 %   Platelets 386 140 - 400 Thousand/uL   MPV 10.4 7.5 - 12.5 fL   Neutro Abs 2,503 1,500 - 7,800 cells/uL   Lymphs Abs 2,279 850 - 3,900 cells/uL   WBC mixed population 678 200 - 950 cells/uL   Eosinophils Absolute 78 15 - 500 cells/uL   Basophils Absolute 62 0 - 200 cells/uL   Neutrophils Relative % 44.7 %   Total Lymphocyte 40.7 %   Monocytes Relative 12.1 %   Eosinophils Relative 1.4 %   Basophils Relative 1.1 %  COMPLETE METABOLIC PANEL WITH GFR  Result Value Ref Range   Glucose, Bld 107 (H) 65 - 99 mg/dL   BUN 15 7 - 25  mg/dL   Creat 1.08 0.70 - 1.25 mg/dL   GFR, Est Non African American 73 > OR = 60 mL/min/1.41m2   GFR, Est African American 85 > OR = 60 mL/min/1.78m2   BUN/Creatinine Ratio NOT APPLICABLE 6 - 22 (calc)   Sodium 136 135 - 146 mmol/L   Potassium 4.8 3.5 - 5.3 mmol/L   Chloride 102 98 - 110 mmol/L   CO2 27 20 - 32 mmol/L   Calcium 9.7 8.6 - 10.3 mg/dL   Total Protein 7.5 6.1 - 8.1 g/dL   Albumin 4.5 3.6 - 5.1 g/dL   Globulin 3.0 1.9 - 3.7 g/dL (calc)   AG Ratio 1.5 1.0 - 2.5 (calc)   Total Bilirubin 1.1 0.2 - 1.2 mg/dL   Alkaline phosphatase (APISO) 36 (L) 40 - 115 U/L   AST 18 10 - 35 U/L   ALT 19 9 - 46 U/L  Lipid panel  Result Value Ref Range   Cholesterol 206 (H) <200 mg/dL   HDL 49 >40 mg/dL   Triglycerides 93 <150 mg/dL   LDL Cholesterol (Calc) 138 (H) mg/dL (calc)   Total CHOL/HDL Ratio 4.2 <5.0 (calc)   Non-HDL Cholesterol (Calc) 157 (H) <130 mg/dL (calc)  PSA, Total with Reflex to PSA, Free  Result Value Ref Range   PSA, Total 0.1 < OR = 4.0 ng/mL  HIV antibody  Result Value Ref Range   HIV 1&2 Ab, 4th Generation NON-REACTIVE NON-REACTI      Assessment & Plan:   Problem List Items Addressed This Visit    None    Visit Diagnoses    Acute non-recurrent maxillary sinusitis    -  Primary   Relevant Medications   amoxicillin-clavulanate (AUGMENTIN) 875-125 MG tablet   History of wheezing       Relevant Medications   albuterol (PROVENTIL HFA;VENTOLIN HFA) 108 (90 Base) MCG/ACT inhaler      # Consistent with acute sinusitis with symptoms worsening with jaw pain, LAD.     Plan: 1.START taking Augmentin 875-125 mg tablets every 12 hours for 10 days.  Discussed completing antibiotic. - While on antibiotic, take a probiotic OTC or from food. - Continue anti-histamine loratadine 10mg  daily. - Can use Flonase 2 sprays each nostril daily for up to 4-6 weeks if no epistaxis. - Start Mucinex-DM OTC for  7-10 days prn congestion 2. Supportive care with nasal saline, warm  herbal tea with honey, 3. Improve hydration 4. Tylenol / Motrin PRN fevers  5. Return criteria given   # Wheezing Patient reports wheezing intermittently.  This always worsens in winter months and needs refill of albuterol.  Refill provided.  No current wheezing or complications of sinusitis noted.  Follow-up prn with Dr. Parks Ranger for wheezing in future.  Meds ordered this encounter  Medications  . amoxicillin-clavulanate (AUGMENTIN) 875-125 MG tablet    Sig: Take 1 tablet by mouth 2 (two) times daily for 10 days.    Dispense:  20 tablet    Refill:  0    Order Specific Question:   Supervising Provider    Answer:   Olin Hauser [[id number]]  . DISCONTD: albuterol (PROVENTIL HFA;VENTOLIN HFA) 108 (90 Base) MCG/ACT inhaler    Sig: Inhale 2 puffs into the lungs every 6 (six) hours as needed for wheezing or shortness of breath.    Dispense:  1 Inhaler    Refill:  2    Order Specific Question:   Supervising Provider    Answer:   Olin Hauser [[id number]]  . albuterol (PROVENTIL HFA;VENTOLIN HFA) 108 (90 Base) MCG/ACT inhaler    Sig: Inhale 2 puffs into the lungs every 6 (six) hours as needed for wheezing or shortness of breath.    Dispense:  1 Inhaler    Refill:  2    Order Specific Question:   Supervising Provider    Answer:   Olin Hauser [[id number]]    Follow up plan: Return 5-7 days if symptoms worsen or fail to improve.  Cassell Smiles, DNP, AGPCNP-BC Adult Gerontology Primary Care Nurse Practitioner [facility] 04/18/2018, 10:08 AM

## 2018-04-18 NOTE — Patient Instructions (Addendum)
Justin Mckee,   Thank you for coming in to clinic today.  1. It sounds like you have a Bacterial sinus infection.  Recommend good hand washing. - START Augmentin 875-125 mg one tablet every 12 hours for 10 days. Make sure to take all doses of your antibiotic. - While you are on an antibiotic, take a probiotic.  Antibiotics kill good and bad bacteria.  A probiotic helps to replace your good bacteria. Probiotic pills can be found over the counter.  One brand is Florastor, but you can use any brand you prefer.  You can also get good bacteria from foods.  These foods are yogurt, kefir, kombucha, and fresh, refrigerated and uncooked sauerkraut. - Drink plenty of fluids.  - Start anti-histamine loratadine or cetirizine 10mg  daily. - You may also use Flonase 2 sprays each nostril daily for up to 4-6 weeks  Other over the counter medications you may try, if needed for symptoms are: - If congestion is worse, start OTC Mucinex (or may try Mucinex-DM for cough) up to 7-10 days then stop - You may try over the counter Nasal Saline spray (Simply Saline, Ocean Spray) as needed to reduce congestion. - Start taking Tylenol extra strength 1 to 2 tablets every 6-8 hours for aches or fever/chills for next few days as needed.  Do not take more than 3,000 mg in 24 hours from all medicines.  You may also take ibuprofen 200-400mg  every 8 hours as needed.   - Drink warm herbal tea with honey for sore throat.   If symptoms are significantly worse with persistent fevers/chills despite tylenol/ibpurofen, nausea, vomiting unable to tolerate food/fluids or medicine, body aches, or shortness of breath, sinus pain pressure or worsening productive cough, then follow-up for re-evaluation, may seek more immediate care at Urgent Care or the ED if you are more concerned that it is an emergency.  Please schedule a follow-up appointment with Cassell Smiles, AGNP. Return 5-7 days if symptoms worsen or fail to improve.  If you have any  other questions or concerns, please feel free to call the clinic or send a message through [place]. You may also schedule an earlier appointment if necessary.  You will receive a survey after today's visit either digitally by e-mail or paper by [employer]. Your experiences and feedback matter to [place].  Please respond so we know how we are doing as we provide care for you.   Cassell Smiles, DNP, AGNP-BC Adult Gerontology Nurse Practitioner [place]

## 2018-04-20 ENCOUNTER — Encounter: Payer: Self-pay | Admitting: Nurse Practitioner

## 2018-05-20 ENCOUNTER — Ambulatory Visit (INDEPENDENT_AMBULATORY_CARE_PROVIDER_SITE_OTHER): Payer: 59 | Admitting: Family Medicine

## 2018-05-20 ENCOUNTER — Encounter: Payer: Self-pay | Admitting: Family Medicine

## 2018-05-20 VITALS — BP 148/84 | HR 76 | Temp 98.6°F | Resp 16 | Ht 70.0 in | Wt 196.0 lb

## 2018-05-20 DIAGNOSIS — H6123 Impacted cerumen, bilateral: Secondary | ICD-10-CM

## 2018-05-20 NOTE — Patient Instructions (Addendum)
Thank you for coming to the office today.  You have thick impacted ear wax (cerumen) blocking ear canals and ear drums. This is the most likely cause of reduced hearing and ear pain and discomfort. - We were able to remove almost all of the ear wax with flushing in the office today  Avoid using Q-tips inside ears, as this can push wax deeper, but you can try to use rolled up kleenex as a wick to absorb fluid and wax as well.   Please schedule a Follow-up Appointment to: Return if symptoms worsen or fail to improve, for ear wax.  If you have any other questions or concerns, please feel free to call the office or send a message through [place]. You may also schedule an earlier appointment if necessary.  Additionally, you may be receiving a survey about your experience at our office within a few days to 1 week by e-mail or mail. We value your feedback.  Nobie Putnam, DO [place]

## 2018-05-20 NOTE — Progress Notes (Signed)
Subjective:    Patient ID: Justin Mckee, male    DOB: Mar 26, 1956, 62 y.o.   MRN: [id number]  Justin Mckee is a 62 y.o. male presenting on 05/20/2018 for Cerumen Impaction   HPI  Cerumen Impaction, bilateral ears Reports new problem, recently had hearing test through work, and he did not do well, they told him he has wax in ears, he has never had them flushed or treated before. He does not use ear drops. He uses ear plugs often but not Q-tips. - Admits maybe cannot hear as well as he thought but does not significantly endorse hearing loss Denies any ear pain or discharge, dizziness, headache, sinus pressure or congestion   Depression screen [facility] 2/9 05/20/2018 02/19/2018 01/16/2018  Decreased Interest 0 0 0  Down, Depressed, Hopeless 0 0 0  PHQ - 2 Score 0 0 0  Altered sleeping - 0 0  Tired, decreased energy - 0 0  Change in appetite - 0 0  Feeling bad or failure about yourself  - 0 0  Trouble concentrating - 0 0  Moving slowly or fidgety/restless - 0 0  Suicidal thoughts - 0 0  PHQ-9 Score - 0 0  Difficult doing work/chores - Not difficult at all Not difficult at all    Social History   Tobacco Use  . Smoking status: Former Smoker    Packs/day: 1.00    Years: 40.00    Pack years: 40.00    Types: Cigarettes  . Smokeless tobacco: Former Systems developer    Quit date: 08/28/2015  Substance Use Topics  . Alcohol use: Yes    Alcohol/week: 24.0 standard drinks    Types: 24 Cans of beer per week    Comment:    . Drug use: No    Review of Systems Per HPI unless specifically indicated above     Objective:    BP (!) 148/84   Pulse 76   Temp 98.6 F (37 C) (Oral)   Resp 16   Ht 5\' 10"  (1.778 m)   Wt 196 lb (88.9 kg)   BMI 28.12 kg/m   Wt Readings from Last 3 Encounters:  05/20/18 196 lb (88.9 kg)  04/18/18 196 lb (88.9 kg)  02/19/18 191 lb (86.6 kg)    Physical Exam  Constitutional: He appears well-developed and well-nourished. No distress.  HENT:  Frontal / maxillary sinuses  non-tender. Nares patent without purulence or edema.  Bilateral TMs not visible due to cerumen impaction bilateral  Oropharynx clear without erythema, exudates, edema or asymmetry.  Conversational hearing intact  Neurological: He is alert.  Skin: Skin is warm and dry. He is not diaphoretic.  Nursing note and vitals reviewed.   ________________________________________________________ PROCEDURE NOTE Date: 05/20/18 Bilateral Ear Lavage / Cerumen Removal Discussed benefits and risks (including pain / discomforts, dizziness, minor abrasion of ear canal). Verbal consent given by patient. Medication:  carbamide peroxide ear drops, Ear Lavage Solution (warm water + hydrogen peroxide) Performed by Dr Parks Ranger Several drops of carbamide peroxide placed in each ear, allowed to sit for few minutes. Ear lavage solution flushed into one ear at a time in attempt to dislodge and remove ear wax. Results successful bilateral. R took significantly longer to successfully flush.  Repeat Ear Exam: - Completely removed cerumen now, with clear ear canals and visible TMs clear and normal appearance.      Assessment & Plan:   Problem List Items Addressed This Visit    None    Visit Diagnoses  Bilateral impacted cerumen    -  Primary      Significant amount of large thick impacted cerumen bilateral ears, suspected primary cause of current reduced bilateral hearing on failed test - No prior hearing aids or hearing evaluation.  Plan: 1. Successful office ear lavage cerumen removal today, re-evaluated with clear ear canals and normal TMs 2. Counseled on avoiding Q-tips and may use Kleenex as wick, use OTC Debrox as needed 3. Follow-up as needed  Return or notify us if hearing eval at work again fails and if he needs ENT or Audiology referral   No orders of the defined types were placed in this encounter.   Follow up plan: Return if symptoms worsen or fail to improve, for ear  wax.  Nobie Putnam, DO [facility] 05/20/2018, 4:45 PM

## 2018-08-26 ENCOUNTER — Other Ambulatory Visit: Payer: Self-pay | Admitting: Family Medicine

## 2018-08-26 ENCOUNTER — Other Ambulatory Visit: Payer: Self-pay

## 2018-08-26 ENCOUNTER — Encounter: Payer: Self-pay | Admitting: Family Medicine

## 2018-08-26 ENCOUNTER — Ambulatory Visit (INDEPENDENT_AMBULATORY_CARE_PROVIDER_SITE_OTHER): Payer: 59 | Admitting: Family Medicine

## 2018-08-26 VITALS — BP 137/78 | HR 72 | Temp 98.6°F | Resp 16 | Ht 70.0 in | Wt 198.0 lb

## 2018-08-26 DIAGNOSIS — E7800 Pure hypercholesterolemia, unspecified: Secondary | ICD-10-CM

## 2018-08-26 DIAGNOSIS — Z8546 Personal history of malignant neoplasm of prostate: Secondary | ICD-10-CM

## 2018-08-26 DIAGNOSIS — R7303 Prediabetes: Secondary | ICD-10-CM

## 2018-08-26 DIAGNOSIS — Z0000 Encounter for general adult medical examination without abnormal findings: Secondary | ICD-10-CM

## 2018-08-26 DIAGNOSIS — M1731 Unilateral post-traumatic osteoarthritis, right knee: Secondary | ICD-10-CM

## 2018-08-26 DIAGNOSIS — R030 Elevated blood-pressure reading, without diagnosis of hypertension: Secondary | ICD-10-CM

## 2018-08-26 DIAGNOSIS — J3089 Other allergic rhinitis: Secondary | ICD-10-CM | POA: Diagnosis not present

## 2018-08-26 DIAGNOSIS — E663 Overweight: Secondary | ICD-10-CM | POA: Diagnosis not present

## 2018-08-26 DIAGNOSIS — J309 Allergic rhinitis, unspecified: Secondary | ICD-10-CM | POA: Insufficient documentation

## 2018-08-26 LAB — POCT GLYCOSYLATED HEMOGLOBIN (HGB A1C): HEMOGLOBIN A1C: 6.2 % — AB (ref 4.0–5.6)

## 2018-08-26 MED ORDER — FLUTICASONE PROPIONATE 50 MCG/ACT NA SUSP: 2.0000 | Freq: Every day | NASAL | 1 refills | Status: DC

## 2018-08-26 NOTE — Assessment & Plan Note (Signed)
Stable w/o worsening with chronic R knee OA/DJD post traumatic Followed by [facility] 12/2015 (has not returned - now limited due to travel) - Prior known injury with fracture s/p surgical repair - Prior trials of PT with improvement - Failed Ibuprofen, Naproxen, Meloxicam (cannot tolerate due to history of GERD)  Plan: 1. Recommend continue current conservative care - Continue Diclofenac gel topical PRN - OTC Tylenol 1g TID PRN - RICE therapy (rest, ice, compression, elevation) for swelling, activity modification - Deferred X-ray / injection - may reconsider if prefer  Follow-up PRN or may notify if interested to return to [facility] to have re-evaluation, he would consider future procedure if indicated

## 2018-08-26 NOTE — Assessment & Plan Note (Signed)
Clinically with BMI >27, with risk factor pre-diabetes, considered overweight  Encourage lifestyle, diet / exercise regimen

## 2018-08-26 NOTE — Patient Instructions (Addendum)
Thank you for coming to the office today.  Ordered flonase nasal spray from [facility] - stay tuned Start nasal steroid Flonase 2 sprays in each nostril daily for 4-6 weeks, may repeat course seasonally or as needed  Recent Labs    11/18/17 1642 02/12/18 0830 08/26/18 1513  HGBA1C 6.5* 6.1* 6.2*    Sugar is stable - improved, keep doing a good job of limiting soda and reducing carb foods  Stay active  If you need help with your R knee and want to return to [facility] call them first - if you need help then let us know - we can refer you if needed  DUE for FASTING BLOOD WORK (no food or drink after midnight before the lab appointment, only water or coffee without cream/sugar on the morning of)  SCHEDULE "Lab Only" visit in the morning at the clinic for lab draw in 6 MONTHS   - Make sure Lab Only appointment is at about 1 week before your next appointment, so that results will be available  For Lab Results, once available within 2-3 days of blood draw, you can can log in to MyChart online to view your results and a brief explanation. Also, we can discuss results at next follow-up visit.   Please schedule a Follow-up Appointment to: Return in about 6 months (around 02/26/2019) for Annual Physical.  If you have any other questions or concerns, please feel free to call the office or send a message through [place]. You may also schedule an earlier appointment if necessary.  Additionally, you may be receiving a survey about your experience at our office within a few days to 1 week by e-mail or mail. We value your feedback.  Nobie Putnam, DO [facility]

## 2018-08-26 NOTE — Assessment & Plan Note (Signed)
Mild early rhinitis symptoms without significant concern or other sign of infection, seasonally driven allergies  Start nasal steroid Flonase 2 sprays in each nostril daily for 4-6 weeks, may repeat course seasonally or as needed (ordered mail order)

## 2018-08-26 NOTE — Assessment & Plan Note (Signed)
Stable to improved control A1c 6.2, from prior 6.1 Improved lifestyle  Plan:  1. Not on any therapy currently 2. Encourage improved lifestyle - low carb, low sugar diet, reduce portion size, continue improving regular exercise 3. Follow-up 6 months annual labs

## 2018-08-26 NOTE — Progress Notes (Signed)
Subjective:    Patient ID: Justin Mckee, male    DOB: 01/15/1956, 63 y.o.   MRN: [id number]  Justin Mckee is a 63 y.o. male presenting on 08/26/2018 for Pre-Diabetes; Knee Pain; and Allergies   HPI   Pre-Diabetes / Obesity BMI >27 History of elevated A1c up to 6.5, now recent trend down to 6.1, due today for A1c Meds: never on meds Lifestyle - Weight unchanged mostly in past 4-5 months - Diet (Still doing well, no significant change, drinks plenty of water, some reduced sodas now few times week max, not daily, reduced starches and carbs) - Exercise (inc regular activity and exercise) Denies hypoglycemia, polyuria  Environmental and Seasonal Allergies / Allergic Rhinitis History of allergic rhinitis. In past improved on Flonase, request refill today, early symptoms but no significant sinus congestion, pressure or drainage  Right Knee Pain chronic / post-traumatic osteoarthritis - Last visit with me in 2019, for same problem, treated withcontinued tylenol and topical rx Diclofenac NSAID, see prior notes for background information. - Previously followed by [facility], last seen 2017, has not had surgery. He did not establish with local Orthopedics, attempted to refer to [facility] - Currently doing well with occasional flare up of discomfort pain and some popping within knee, does not seem to significantly limit his daily function or ambulation - Still using topical Diclofenac PRN - Limited use of Tylenol - He has completed PT in past with improvement - No new injury - Admits R knee pain - Denies any worsening swelling, redness, other joint pain  Health Maintenance: Due for Flu Shot, declines today despite counseling on benefits   Depression screen [facility] 2/9 08/26/2018 05/20/2018 02/19/2018  Decreased Interest 0 0 0  Down, Depressed, Hopeless 0 0 0  PHQ - 2 Score 0 0 0  Altered sleeping - - 0  Tired, decreased energy - - 0  Change in appetite - - 0  Feeling bad or failure about  yourself  - - 0  Trouble concentrating - - 0  Moving slowly or fidgety/restless - - 0  Suicidal thoughts - - 0  PHQ-9 Score - - 0  Difficult doing work/chores - - Not difficult at all    Social History   Tobacco Use  . Smoking status: Former Smoker    Packs/day: 1.00    Years: 40.00    Pack years: 40.00    Types: Cigarettes  . Smokeless tobacco: Former Systems developer    Quit date: 08/28/2015  Substance Use Topics  . Alcohol use: Yes    Alcohol/week: 24.0 standard drinks    Types: 24 Cans of beer per week    Comment:    . Drug use: No    Review of Systems Per HPI unless specifically indicated above     Objective:    BP 137/78   Pulse 72   Temp 98.6 F (37 C) (Oral)   Resp 16   Ht 5\' 10"  (1.778 m)   Wt 198 lb (89.8 kg)   BMI 28.41 kg/m   Wt Readings from Last 3 Encounters:  08/26/18 198 lb (89.8 kg)  05/20/18 196 lb (88.9 kg)  04/18/18 196 lb (88.9 kg)    Physical Exam Vitals signs and nursing note reviewed.  Constitutional:      General: He is not in acute distress.    Appearance: He is well-developed. He is not diaphoretic.     Comments: Well-appearing, comfortable, cooperative  HENT:     Head: Normocephalic and atraumatic.  Eyes:     General:        Right eye: No discharge.        Left eye: No discharge.     Conjunctiva/sclera: Conjunctivae normal.  Cardiovascular:     Rate and Rhythm: Normal rate.  Pulmonary:     Effort: Pulmonary effort is normal.  Musculoskeletal: Normal range of motion.        General: No swelling or tenderness.     Comments: Right Knee Inspection: Mildly bulky appearance. No ecchymosis or effusion. Palpation: Non-tender joint line. Fine crepitus, rare pop on movement. ROM: Full active ROM bilaterally Strength: 5/5 intact knee flex/ext, ankle dorsi/plantarflex Neurovascular: distally intact sensation light touch and pulses   Skin:    General: Skin is warm and dry.     Findings: No erythema or rash.  Neurological:     Mental  Status: He is alert and oriented to person, place, and time.  Psychiatric:        Behavior: Behavior normal.     Comments: Well groomed, good eye contact, normal speech and thoughts      Recent Labs    11/18/17 1642 02/12/18 0830 08/26/18 1513  HGBA1C 6.5* 6.1* 6.2*    Results for orders placed or performed in visit on 08/26/18  POCT glycosylated hemoglobin (Hb A1C)  Result Value Ref Range   Hemoglobin A1C 6.2 (A) 4.0 - 5.6 %      Assessment & Plan:   Problem List Items Addressed This Visit    Allergic rhinitis due to allergen    Mild early rhinitis symptoms without significant concern or other sign of infection, seasonally driven allergies  Start nasal steroid Flonase 2 sprays in each nostril daily for 4-6 weeks, may repeat course seasonally or as needed (ordered mail order)      Relevant Medications   fluticasone (FLONASE) 50 MCG/ACT nasal spray   Overweight (BMI 25.0-29.9)    Clinically with BMI >27, with risk factor pre-diabetes, considered overweight  Encourage lifestyle, diet / exercise regimen      Post-traumatic osteoarthritis of right knee    Stable w/o worsening with chronic R knee OA/DJD post traumatic Followed by [facility] 12/2015 (has not returned - now limited due to travel) - Prior known injury with fracture s/p surgical repair - Prior trials of PT with improvement - Failed Ibuprofen, Naproxen, Meloxicam (cannot tolerate due to history of GERD)  Plan: 1. Recommend continue current conservative care - Continue Diclofenac gel topical PRN - OTC Tylenol 1g TID PRN - RICE therapy (rest, ice, compression, elevation) for swelling, activity modification - Deferred X-ray / injection - may reconsider if prefer  Follow-up PRN or may notify if interested to return to [facility] to have re-evaluation, he would consider future procedure if indicated      Pre-diabetes - Primary    Stable to improved control A1c 6.2, from prior 6.1 Improved lifestyle  Plan:   1. Not on any therapy currently 2. Encourage improved lifestyle - low carb, low sugar diet, reduce portion size, continue improving regular exercise 3. Follow-up 6 months annual labs      Relevant Orders   POCT glycosylated hemoglobin (Hb A1C) (Completed)    Other Visit Diagnoses    Environmental and seasonal allergies       Relevant Medications   fluticasone (FLONASE) 50 MCG/ACT nasal spray      Meds ordered this encounter  Medications  . fluticasone (FLONASE) 50 MCG/ACT nasal spray    Sig: Place 2  sprays into both nostrils daily. May use seasonally    Dispense:  48 g    Refill:  1     Follow up plan: Return in about 6 months (around 02/26/2019) for Annual Physical.  Future labs ordered for 02/25/19  Nobie Putnam, [facility] 08/26/2018, 3:11 PM

## 2019-02-25 ENCOUNTER — Other Ambulatory Visit: Payer: Self-pay

## 2019-02-25 DIAGNOSIS — R030 Elevated blood-pressure reading, without diagnosis of hypertension: Secondary | ICD-10-CM

## 2019-02-25 DIAGNOSIS — R7303 Prediabetes: Secondary | ICD-10-CM

## 2019-02-25 DIAGNOSIS — Z0000 Encounter for general adult medical examination without abnormal findings: Secondary | ICD-10-CM | POA: Diagnosis not present

## 2019-02-25 DIAGNOSIS — E7800 Pure hypercholesterolemia, unspecified: Secondary | ICD-10-CM | POA: Diagnosis not present

## 2019-02-25 DIAGNOSIS — Z8546 Personal history of malignant neoplasm of prostate: Secondary | ICD-10-CM

## 2019-02-26 LAB — CBC WITH DIFFERENTIAL/PLATELET
Absolute Monocytes: 661 cells/uL (ref 200–950)
Basophils Absolute: 47 cells/uL (ref 0–200)
Basophils Relative: 0.8 %
Eosinophils Absolute: 89 cells/uL (ref 15–500)
Eosinophils Relative: 1.5 %
HCT: 41.9 % (ref 38.5–50.0)
Hemoglobin: 14.2 g/dL (ref 13.2–17.1)
Lymphs Abs: 2177 cells/uL (ref 850–3900)
MCH: 31.1 pg (ref 27.0–33.0)
MCHC: 33.9 g/dL (ref 32.0–36.0)
MCV: 91.9 fL (ref 80.0–100.0)
MPV: 10.3 fL (ref 7.5–12.5)
Monocytes Relative: 11.2 %
Neutro Abs: 2926 cells/uL (ref 1500–7800)
Neutrophils Relative %: 49.6 %
Platelets: 336 10*3/uL (ref 140–400)
RBC: 4.56 10*6/uL (ref 4.20–5.80)
RDW: 14.5 % (ref 11.0–15.0)
Total Lymphocyte: 36.9 %
WBC: 5.9 10*3/uL (ref 3.8–10.8)

## 2019-02-26 LAB — COMPLETE METABOLIC PANEL WITH GFR
AG Ratio: 1.4 (calc) (ref 1.0–2.5)
ALT: 28 U/L (ref 9–46)
AST: 25 U/L (ref 10–35)
Albumin: 4.5 g/dL (ref 3.6–5.1)
Alkaline phosphatase (APISO): 40 U/L (ref 35–144)
BUN: 10 mg/dL (ref 7–25)
CO2: 27 mmol/L (ref 20–32)
Calcium: 10.5 mg/dL — ABNORMAL HIGH (ref 8.6–10.3)
Chloride: 102 mmol/L (ref 98–110)
Creat: 1.2 mg/dL (ref 0.70–1.25)
GFR, Est African American: 74 mL/min/{1.73_m2} (ref >=60)
GFR, Est Non African American: 64 mL/min/{1.73_m2} (ref >=60)
Globulin: 3.2 g/dL (calc) (ref 1.9–3.7)
Glucose, Bld: 121 mg/dL — ABNORMAL HIGH (ref 65–99)
Potassium: 4.8 mmol/L (ref 3.5–5.3)
Sodium: 137 mmol/L (ref 135–146)
Total Bilirubin: 0.8 mg/dL (ref 0.2–1.2)
Total Protein: 7.7 g/dL (ref 6.1–8.1)

## 2019-02-26 LAB — LIPID PANEL
Cholesterol: 194 mg/dL (ref <200)
HDL: 48 mg/dL (ref >=40)
LDL Cholesterol (Calc): 129 mg/dL (calc) — ABNORMAL HIGH
Non-HDL Cholesterol (Calc): 146 mg/dL (calc) — ABNORMAL HIGH (ref <130)
Total CHOL/HDL Ratio: 4 (calc) (ref <5.0)
Triglycerides: 71 mg/dL (ref <150)

## 2019-02-26 LAB — HEMOGLOBIN A1C
Hgb A1c MFr Bld: 6.2 % of total Hgb — ABNORMAL HIGH (ref <5.7)
Mean Plasma Glucose: 131 (calc)
eAG (mmol/L): 7.3 (calc)

## 2019-02-26 LAB — PSA: PSA: 0.1 ng/mL (ref <=4.0)

## 2019-03-02 ENCOUNTER — Other Ambulatory Visit (INDEPENDENT_AMBULATORY_CARE_PROVIDER_SITE_OTHER): Payer: BC Managed Care – PPO | Admitting: Family Medicine

## 2019-03-02 ENCOUNTER — Encounter: Payer: Self-pay | Admitting: Family Medicine

## 2019-03-02 ENCOUNTER — Ambulatory Visit (INDEPENDENT_AMBULATORY_CARE_PROVIDER_SITE_OTHER): Payer: BC Managed Care – PPO | Admitting: Family Medicine

## 2019-03-02 ENCOUNTER — Other Ambulatory Visit: Payer: Self-pay

## 2019-03-02 VITALS — BP 140/88 | HR 76 | Resp 16 | Ht 70.0 in | Wt 195.5 lb

## 2019-03-02 DIAGNOSIS — E7800 Pure hypercholesterolemia, unspecified: Secondary | ICD-10-CM | POA: Diagnosis not present

## 2019-03-02 DIAGNOSIS — Z8546 Personal history of malignant neoplasm of prostate: Secondary | ICD-10-CM

## 2019-03-02 DIAGNOSIS — N529 Male erectile dysfunction, unspecified: Secondary | ICD-10-CM

## 2019-03-02 DIAGNOSIS — K219 Gastro-esophageal reflux disease without esophagitis: Secondary | ICD-10-CM

## 2019-03-02 DIAGNOSIS — Z23 Encounter for immunization: Secondary | ICD-10-CM | POA: Diagnosis not present

## 2019-03-02 DIAGNOSIS — R030 Elevated blood-pressure reading, without diagnosis of hypertension: Secondary | ICD-10-CM

## 2019-03-02 DIAGNOSIS — Z0000 Encounter for general adult medical examination without abnormal findings: Secondary | ICD-10-CM

## 2019-03-02 DIAGNOSIS — M1731 Unilateral post-traumatic osteoarthritis, right knee: Secondary | ICD-10-CM

## 2019-03-02 DIAGNOSIS — R7303 Prediabetes: Secondary | ICD-10-CM | POA: Diagnosis not present

## 2019-03-02 DIAGNOSIS — J3089 Other allergic rhinitis: Secondary | ICD-10-CM

## 2019-03-02 MED ORDER — SILDENAFIL CITRATE 20 MG PO TABS: ORAL_TABLET | ORAL | 5 refills | Status: DC

## 2019-03-02 MED ORDER — FLUTICASONE PROPIONATE 50 MCG/ACT NA SUSP: 2.0000 | Freq: Every day | NASAL | 3 refills | Status: AC

## 2019-03-02 MED ORDER — DICLOFENAC SODIUM 1 % TD GEL: 2.0000 g | Freq: Two times a day (BID) | TRANSDERMAL | 3 refills | Status: AC

## 2019-03-02 MED ORDER — OMEPRAZOLE 20 MG PO CPDR: 20.0000 mg | DELAYED_RELEASE_CAPSULE | Freq: Every day | ORAL | 3 refills | Status: DC

## 2019-03-02 NOTE — Addendum Note (Signed)
Addended by: Olin Hauser on: 03/02/2019 03:46 PM   Modules accepted: Level of Service
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
## 2019-03-02 NOTE — Patient Instructions (Addendum)
Thank you for coming to the office today.  Double check the Vitamin D3 pills - to see if it has calcium included in it as well - your calcium was slightly higher today. If it does have calcium we can STOP this and switch to a Vitamin D3 that does not include calcium.  Try to check BP at work at least 1 x a week, write down medication.  Normal sugar, prostate, cholesterol.  Please schedule a Follow-up Appointment to: Return in about 3 months (around 06/01/2019) for 3 month HTN - virtual or in person.  If you have any other questions or concerns, please feel free to call the office or send a message through [place]. You may also schedule an earlier appointment if necessary.  Additionally, you may be receiving a survey about your experience at our office within a few days to 1 week by e-mail or mail. We value your feedback.  Nobie Putnam, DO [place]

## 2019-06-02 ENCOUNTER — Encounter: Payer: Self-pay | Admitting: Family Medicine

## 2019-06-02 ENCOUNTER — Ambulatory Visit (INDEPENDENT_AMBULATORY_CARE_PROVIDER_SITE_OTHER): Payer: BC Managed Care – PPO | Admitting: Family Medicine

## 2019-06-02 ENCOUNTER — Other Ambulatory Visit: Payer: Self-pay

## 2019-06-02 VITALS — BP 130/80 | Ht 70.0 in | Wt 197.0 lb

## 2019-06-02 DIAGNOSIS — M1731 Unilateral post-traumatic osteoarthritis, right knee: Secondary | ICD-10-CM | POA: Diagnosis not present

## 2019-06-02 DIAGNOSIS — R030 Elevated blood-pressure reading, without diagnosis of hypertension: Secondary | ICD-10-CM

## 2019-06-02 NOTE — Assessment & Plan Note (Signed)
Not consistent with HTN at this time Home / work readings reviewed, normal range Reassurance No new HTN dx or medication Follow-up in office as scheduled 3 months

## 2019-06-02 NOTE — Patient Instructions (Addendum)
[facility] Address: [address], [place], [place] [address] Hours:  9AM-5PM Phone: [phone]  Please schedule a Follow-up Appointment to: Return in about 3 months (around 08/31/2019) for 3 month PreDM A1c, HTN, f/u orthopedic knee.  If you have any other questions or concerns, please feel free to call the office or send a message through [place]. You may also schedule an earlier appointment if necessary.  Additionally, you may be receiving a survey about your experience at our office within a few days to 1 week by e-mail or mail. We value your feedback.  Nobie Putnam, DO [place]
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
## 2019-07-16 DIAGNOSIS — S8391XA Sprain of unspecified site of right knee, initial encounter: Secondary | ICD-10-CM | POA: Diagnosis not present

## 2019-07-16 DIAGNOSIS — M25561 Pain in right knee: Secondary | ICD-10-CM | POA: Diagnosis not present

## 2019-07-16 DIAGNOSIS — M1711 Unilateral primary osteoarthritis, right knee: Secondary | ICD-10-CM | POA: Diagnosis not present

## 2019-07-22 ENCOUNTER — Other Ambulatory Visit: Payer: Self-pay

## 2019-07-22 ENCOUNTER — Ambulatory Visit: Payer: BLUE CROSS/BLUE SHIELD | Attending: Internal Medicine

## 2019-07-22 DIAGNOSIS — Z20822 Contact with and (suspected) exposure to covid-19: Secondary | ICD-10-CM

## 2019-07-23 LAB — NOVEL CORONAVIRUS, NAA: SARS-CoV-2, NAA: DETECTED — AB

## 2019-08-01 ENCOUNTER — Encounter: Payer: Self-pay | Admitting: Emergency Medicine

## 2019-08-01 ENCOUNTER — Other Ambulatory Visit: Payer: Self-pay

## 2019-08-01 ENCOUNTER — Emergency Department
Admission: EM | Admit: 2019-08-01 | Discharge: 2019-08-01 | Disposition: A | Discharge status: Home / Self Care | Payer: BC Managed Care – PPO | Attending: Emergency Medicine | Admitting: Emergency Medicine

## 2019-08-01 DIAGNOSIS — Z79899 Other long term (current) drug therapy: Secondary | ICD-10-CM | POA: Insufficient documentation

## 2019-08-01 DIAGNOSIS — Z8546 Personal history of malignant neoplasm of prostate: Secondary | ICD-10-CM | POA: Insufficient documentation

## 2019-08-01 DIAGNOSIS — H9203 Otalgia, bilateral: Secondary | ICD-10-CM

## 2019-08-01 DIAGNOSIS — Z8616 Personal history of COVID-19: Secondary | ICD-10-CM | POA: Diagnosis not present

## 2019-08-01 DIAGNOSIS — H6123 Impacted cerumen, bilateral: Secondary | ICD-10-CM | POA: Diagnosis not present

## 2019-08-01 DIAGNOSIS — Z87891 Personal history of nicotine dependence: Secondary | ICD-10-CM | POA: Diagnosis not present

## 2019-08-01 MED ORDER — SODIUM CHLORIDE 0.9 % IR SOLN
Freq: Once | Status: AC
  Filled 2019-08-01: qty 500

## 2019-08-01 NOTE — ED Notes (Signed)
Both ears irrigated, Dr Beather Arbour informed; both ears released plugs of cerumen and tympanic membrane now visible, redness to left and right membrane seen at 12 o'clock position

## 2019-08-01 NOTE — Discharge Instructions (Addendum)
I recommend Debrox over-the-counter earwax removal kits to clean your ears.  Return to the ER for worsening symptoms, persistent vomiting, difficulty breathing or other concerns.

## 2019-08-01 NOTE — ED Triage Notes (Signed)
Pt arrives ambulatory to triage with c/o bilateral ear pain for "a week or so". Pt is in NAD.

## 2019-08-01 NOTE — ED Notes (Signed)
No peripheral IV placed this visit.   Discharge instructions reviewed with patient. Questions fielded by this RN. Patient verbalizes understanding of instructions. Patient discharged home in stable condition per sung. No acute distress noted at time of discharge.   Pt ambulatory to parking lot after DC

## 2019-08-01 NOTE — ED Notes (Signed)
Pt reports 1 week of bilateral ear pain; no prior hx of ear infections; both canal occluded by cerumen
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
## 2019-08-05 ENCOUNTER — Inpatient Hospital Stay
Admission: EM | Admit: 2019-08-05 | Discharge: 2019-08-07 | DRG: 175 | Disposition: A | Discharge status: Home / Self Care | Payer: BC Managed Care – PPO | Attending: Family Medicine | Admitting: Family Medicine

## 2019-08-05 ENCOUNTER — Emergency Department: Payer: BC Managed Care – PPO

## 2019-08-05 ENCOUNTER — Encounter: Payer: Self-pay | Admitting: Emergency Medicine

## 2019-08-05 ENCOUNTER — Other Ambulatory Visit: Payer: Self-pay

## 2019-08-05 DIAGNOSIS — Z87891 Personal history of nicotine dependence: Secondary | ICD-10-CM | POA: Diagnosis not present

## 2019-08-05 DIAGNOSIS — Z86010 Personal history of colonic polyps: Secondary | ICD-10-CM

## 2019-08-05 DIAGNOSIS — D473 Essential (hemorrhagic) thrombocythemia: Secondary | ICD-10-CM

## 2019-08-05 DIAGNOSIS — I2693 Single subsegmental pulmonary embolism without acute cor pulmonale: Principal | ICD-10-CM | POA: Diagnosis present

## 2019-08-05 DIAGNOSIS — R918 Other nonspecific abnormal finding of lung field: Secondary | ICD-10-CM | POA: Diagnosis not present

## 2019-08-05 DIAGNOSIS — Z9079 Acquired absence of other genital organ(s): Secondary | ICD-10-CM | POA: Diagnosis not present

## 2019-08-05 DIAGNOSIS — D75839 Thrombocytosis, unspecified: Secondary | ICD-10-CM

## 2019-08-05 DIAGNOSIS — Z888 Allergy status to other drugs, medicaments and biological substances status: Secondary | ICD-10-CM | POA: Diagnosis not present

## 2019-08-05 DIAGNOSIS — R7989 Other specified abnormal findings of blood chemistry: Secondary | ICD-10-CM | POA: Diagnosis present

## 2019-08-05 DIAGNOSIS — J1282 Pneumonia due to coronavirus disease 2019: Secondary | ICD-10-CM | POA: Diagnosis not present

## 2019-08-05 DIAGNOSIS — N529 Male erectile dysfunction, unspecified: Secondary | ICD-10-CM | POA: Diagnosis present

## 2019-08-05 DIAGNOSIS — U071 COVID-19: Secondary | ICD-10-CM | POA: Diagnosis not present

## 2019-08-05 DIAGNOSIS — R079 Chest pain, unspecified: Secondary | ICD-10-CM | POA: Diagnosis not present

## 2019-08-05 DIAGNOSIS — J439 Emphysema, unspecified: Secondary | ICD-10-CM | POA: Diagnosis not present

## 2019-08-05 DIAGNOSIS — I2699 Other pulmonary embolism without acute cor pulmonale: Secondary | ICD-10-CM | POA: Diagnosis not present

## 2019-08-05 DIAGNOSIS — Z8546 Personal history of malignant neoplasm of prostate: Secondary | ICD-10-CM | POA: Diagnosis not present

## 2019-08-05 DIAGNOSIS — R7303 Prediabetes: Secondary | ICD-10-CM | POA: Diagnosis present

## 2019-08-05 DIAGNOSIS — R911 Solitary pulmonary nodule: Secondary | ICD-10-CM | POA: Diagnosis not present

## 2019-08-05 DIAGNOSIS — I2609 Other pulmonary embolism with acute cor pulmonale: Secondary | ICD-10-CM | POA: Diagnosis not present

## 2019-08-05 DIAGNOSIS — K219 Gastro-esophageal reflux disease without esophagitis: Secondary | ICD-10-CM | POA: Diagnosis present

## 2019-08-05 DIAGNOSIS — R05 Cough: Secondary | ICD-10-CM | POA: Diagnosis not present

## 2019-08-05 DIAGNOSIS — Z91013 Allergy to seafood: Secondary | ICD-10-CM | POA: Diagnosis not present

## 2019-08-05 LAB — CBC
HCT: 39.3 % (ref 39.0–52.0)
Hemoglobin: 13.1 g/dL (ref 13.0–17.0)
MCH: 29.7 pg (ref 26.0–34.0)
MCHC: 33.3 g/dL (ref 30.0–36.0)
MCV: 89.1 fL (ref 80.0–100.0)
Platelets: 640 10*3/uL — ABNORMAL HIGH (ref 150–400)
RBC: 4.41 MIL/uL (ref 4.22–5.81)
RDW: 13.8 % (ref 11.5–15.5)
WBC: 9.5 10*3/uL (ref 4.0–10.5)
nRBC: 0 % (ref 0.0–0.2)

## 2019-08-05 LAB — BASIC METABOLIC PANEL
Anion gap: 12 (ref 5–15)
BUN: 11 mg/dL (ref 8–23)
CO2: 23 mmol/L (ref 22–32)
Calcium: 9.3 mg/dL (ref 8.9–10.3)
Chloride: 101 mmol/L (ref 98–111)
Creatinine, Ser: 1.09 mg/dL (ref 0.61–1.24)
GFR calc Af Amer: >60 mL/min (ref >60)
GFR calc non Af Amer: >60 mL/min (ref >60)
Glucose, Bld: 91 mg/dL (ref 70–99)
Potassium: 3.8 mmol/L (ref 3.5–5.1)
Sodium: 136 mmol/L (ref 135–145)

## 2019-08-05 LAB — APTT: aPTT: 34 seconds (ref 24–36)

## 2019-08-05 LAB — TROPONIN I (HIGH SENSITIVITY)
Troponin I (High Sensitivity): 3 ng/L (ref <18)
Troponin I (High Sensitivity): 3 ng/L (ref <18)

## 2019-08-05 LAB — PROTIME-INR
INR: 1.1 (ref 0.8–1.2)
Prothrombin Time: 13.9 seconds (ref 11.4–15.2)

## 2019-08-05 LAB — FIBRIN DERIVATIVES D-DIMER (ARMC ONLY): Fibrin derivatives D-dimer (ARMC): 1579.36 ng/mL (FEU) — ABNORMAL HIGH (ref 0.00–499.00)

## 2019-08-05 MED ORDER — HEPARIN (PORCINE) 25000 UT/250ML-% IV SOLN
1550.0000 [IU]/h | INTRAVENOUS | Status: AC
  Administered 2019-08-05: 20:00:00 1350 [IU]/h via INTRAVENOUS
  Administered 2019-08-06: 1550 [IU]/h via INTRAVENOUS
  Filled 2019-08-05 (×2): qty 250

## 2019-08-05 MED ORDER — PANTOPRAZOLE SODIUM 40 MG PO TBEC
40.0000 mg | DELAYED_RELEASE_TABLET | Freq: Every day | ORAL | Status: DC
  Administered 2019-08-06 – 2019-08-07 (×2): 40 mg via ORAL
  Filled 2019-08-05 (×2): qty 1

## 2019-08-05 MED ORDER — ASPIRIN 81 MG PO CHEW
324.0000 mg | CHEWABLE_TABLET | Freq: Once | ORAL | Status: AC
  Administered 2019-08-05: 16:00:00 324 mg via ORAL
  Filled 2019-08-05: qty 4

## 2019-08-05 MED ORDER — IOHEXOL 350 MG/ML SOLN
75.0000 mL | Freq: Once | INTRAVENOUS | Status: AC | PRN
  Administered 2019-08-05: 75 mL via INTRAVENOUS
  Filled 2019-08-05: qty 75

## 2019-08-05 MED ORDER — HEPARIN BOLUS VIA INFUSION
4300.0000 [IU] | Freq: Once | INTRAVENOUS | Status: AC
  Administered 2019-08-05: 20:00:00 4300 [IU] via INTRAVENOUS
  Filled 2019-08-05: qty 4300
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
## 2019-08-05 NOTE — ED Notes (Signed)
Pt room assignment can not take him due to recent covid positive test.  Await new bed placement.

## 2019-08-06 DIAGNOSIS — I2609 Other pulmonary embolism with acute cor pulmonale: Secondary | ICD-10-CM | POA: Diagnosis not present

## 2019-08-06 LAB — BASIC METABOLIC PANEL
Anion gap: 9 (ref 5–15)
BUN: 10 mg/dL (ref 8–23)
CO2: 26 mmol/L (ref 22–32)
Calcium: 9.4 mg/dL (ref 8.9–10.3)
Chloride: 102 mmol/L (ref 98–111)
Creatinine, Ser: 0.93 mg/dL (ref 0.61–1.24)
GFR calc Af Amer: >60 mL/min (ref >60)
GFR calc non Af Amer: >60 mL/min (ref >60)
Glucose, Bld: 105 mg/dL — ABNORMAL HIGH (ref 70–99)
Potassium: 4.4 mmol/L (ref 3.5–5.1)
Sodium: 137 mmol/L (ref 135–145)

## 2019-08-06 LAB — CBC
HCT: 40.1 % (ref 39.0–52.0)
Hemoglobin: 13 g/dL (ref 13.0–17.0)
MCH: 29.9 pg (ref 26.0–34.0)
MCHC: 32.4 g/dL (ref 30.0–36.0)
MCV: 92.2 fL (ref 80.0–100.0)
Platelets: 653 10*3/uL — ABNORMAL HIGH (ref 150–400)
RBC: 4.35 MIL/uL (ref 4.22–5.81)
RDW: 14.3 % (ref 11.5–15.5)
WBC: 9.4 10*3/uL (ref 4.0–10.5)
nRBC: 0 % (ref 0.0–0.2)

## 2019-08-06 LAB — HEPARIN LEVEL (UNFRACTIONATED): Heparin Unfractionated: <0.1 IU/mL — ABNORMAL LOW (ref 0.30–0.70)

## 2019-08-06 LAB — HIV ANTIBODY (ROUTINE TESTING W REFLEX): HIV Screen 4th Generation wRfx: NONREACTIVE

## 2019-08-06 MED ORDER — HEPARIN BOLUS VIA INFUSION
2600.0000 [IU] | Freq: Once | INTRAVENOUS | Status: AC
  Administered 2019-08-06: 2600 [IU] via INTRAVENOUS
  Filled 2019-08-06: qty 2600

## 2019-08-06 MED ORDER — DEXAMETHASONE 4 MG PO TABS
6.0000 mg | ORAL_TABLET | Freq: Every day | ORAL | Status: DC
  Administered 2019-08-06 – 2019-08-07 (×2): 6 mg via ORAL
  Filled 2019-08-06 (×2): qty 2

## 2019-08-06 MED ORDER — OXYCODONE HCL 5 MG PO TABS
5.0000 mg | ORAL_TABLET | Freq: Four times a day (QID) | ORAL | Status: DC | PRN
  Administered 2019-08-06 (×2): 5 mg via ORAL
  Filled 2019-08-06 (×2): qty 1

## 2019-08-06 MED ORDER — GUAIFENESIN-DM 100-10 MG/5ML PO SYRP
5.0000 mL | ORAL_SOLUTION | ORAL | Status: DC | PRN
  Administered 2019-08-06 – 2019-08-07 (×2): 5 mL via ORAL
  Filled 2019-08-06 (×2): qty 5

## 2019-08-06 MED ORDER — RIVAROXABAN 15 MG PO TABS
15.0000 mg | ORAL_TABLET | Freq: Two times a day (BID) | ORAL | Status: DC
  Administered 2019-08-06 – 2019-08-07 (×3): 15 mg via ORAL
  Filled 2019-08-06 (×4): qty 1

## 2019-08-06 MED ORDER — ACETAMINOPHEN 325 MG PO TABS
650.0000 mg | ORAL_TABLET | Freq: Four times a day (QID) | ORAL | Status: DC | PRN
  Administered 2019-08-06: 650 mg via ORAL
  Filled 2019-08-06: qty 2

## 2019-08-06 MED ORDER — RIVAROXABAN 20 MG PO TABS: 20.0000 mg | ORAL_TABLET | Freq: Every day | ORAL | Status: DC

## 2019-08-06 MED ORDER — RIVAROXABAN 15 MG PO TABS: 15.0000 mg | ORAL_TABLET | Freq: Two times a day (BID) | ORAL | Status: DC

## 2019-08-06 NOTE — Progress Notes (Signed)
Assumed care of pt. Report received from Edwin, RN.   
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
## 2019-08-06 NOTE — Progress Notes (Signed)
PROGRESS NOTE    Justin Mckee  [id number] DOB: 1955/07/01 DOA: 08/05/2019 PCP: Olin Hauser, DO      Brief Narrative:  Mr. Cruse is a 64 y.o. M with hx ProsCA s/p prostatectomy and recent COVID infection earlier this month who presented with chest pain acutely starting on day of admission.  Was sitting at home, developed anterior pressure like CP.  In the ER, hemodynamically stable.  CTA obtained that showed distal segmental and subsegmental PE.  Started on heparin infusion.        Assessment & Plan:  Acute pulmonary embolism Distal.  First time.  Provoked. -Stop heparin -Start Eliquis  GERD -Continue pantoprazole  Coronavirus infection resolved Patient developed headache, nasal congestion earlier this month.  Tested positive on Feb 10.  Isolated 10 days, symptoms of headache and congestion improved, but then cough worsened yesteray.  CT chest showed pneumonia.  Ambiguity about whether he is clinically better or not.  Will keep in isolation.  At present I do not believe he needs remdesivir. -Start dexamethasone        Disposition: The patient was admitted with acute pulmonary embolism.  He has ongoing pain and respiratory rate up to 30s due to pain.  Will switch to oral anticoagulant and start analgesics.  Likely home tomorrow if pain controlled and RR improved.        DVT prophylaxis: N/A Code Status: FULL Family Communication:  MDM: The below labs and imaging reports were reviewed and summarized above.  Medication management as above  Anticoag ordered.   Procedures:   2/24 CTA chest      Subjective: Has chest pain, cough.  No hemoptysis.  No confusion, syncope, fever.  No vomiting.  No respiratory distress.    Objective: Vitals:   08/05/19 2100 08/05/19 2200 08/05/19 2300 08/05/19 2338  BP: 139/90 128/86 (!) 137/95 (!) 137/96  Pulse: 81 79 75 81  Resp:    (!) 25  Temp:    98.3 F (36.8 C)  TempSrc:    Oral  SpO2: 91% 92%  94% 98%  Weight:      Height:        Intake/Output Summary (Last 24 hours) at 08/06/2019 1234 Last data filed at 08/06/2019 0539 Gross per 24 hour  Intake 324.47 ml  Output 700 ml  Net -375.53 ml   Filed Weights   08/05/19 1352  Weight: 86.2 kg    Examination: General appearance:  adult male, alert and in no acute distress.   HEENT: Anicteric, conjunctiva pink, lids and lashes normal. No nasal deformity, discharge, epistaxis.  Lips moist.   Skin: Warm and dry.  no jaundice.  No suspicious rashes or lesions. Cardiac: RRR, nl S1-S2, no murmurs appreciated.  Capillary refill is brisk.  JVP not visible.  No LE edema.  Radial pulses 2+ and symmetric. Respiratory: Tachypneic.  CTAB without rales or wheezes. Abdomen: Abdomen soft.  no TTP. No ascites, distension, hepatosplenomegaly.   MSK: No deformities or effusions. Neuro: Awake and alert.  EOMI, moves all extremities. Speech fluent.    Psych: Sensorium intact and responding to questions, attention normal. Affect normal.  Judgment and insight appear normal.          Data Reviewed: I have personally reviewed following labs and imaging studies:  CBC: Recent Labs  Lab 08/05/19 1401 08/05/19 2100  WBC 9.5 9.4  HGB 13.1 13.0  HCT 39.3 40.1  MCV 89.1 92.2  PLT 640* [id number]*   Basic Metabolic Panel:  Recent Labs  Lab 08/05/19 1401 08/06/19 0438  NA 136 137  K 3.8 4.4  CL 101 102  CO2 23 26  GLUCOSE 91 105*  BUN 11 10  CREATININE 1.09 0.93  CALCIUM 9.3 9.4   GFR: Estimated Creatinine Clearance: 82.7 mL/min (by C-G formula based on SCr of 0.93 mg/dL). Liver Function Tests: No results for input(s): AST, ALT, ALKPHOS, BILITOT, PROT, ALBUMIN in the last 168 hours. No results for input(s): LIPASE, AMYLASE in the last 168 hours. No results for input(s): AMMONIA in the last 168 hours. Coagulation Profile: Recent Labs  Lab 08/05/19 1944  INR 1.1   Cardiac Enzymes: No results for input(s): CKTOTAL, CKMB, CKMBINDEX,  TROPONINI in the last 168 hours. BNP (last 3 results) No results for input(s): PROBNP in the last 8760 hours. HbA1C: No results for input(s): HGBA1C in the last 72 hours. CBG: No results for input(s): GLUCAP in the last 168 hours. Lipid Profile: No results for input(s): CHOL, HDL, LDLCALC, TRIG, CHOLHDL, LDLDIRECT in the last 72 hours. Thyroid Function Tests: No results for input(s): TSH, T4TOTAL, FREET4, T3FREE, THYROIDAB in the last 72 hours. Anemia Panel: No results for input(s): VITAMINB12, FOLATE, FERRITIN, TIBC, IRON, RETICCTPCT in the last 72 hours. Urine analysis:    Component Value Date/Time   COLORURINE YELLOW (A) 08/28/2015 0240   APPEARANCEUR CLEAR (A) 08/28/2015 0240   LABSPEC 1.012 08/28/2015 0240   PHURINE 6.0 08/28/2015 0240   GLUCOSEU 50 (A) 08/28/2015 0240   HGBUR 2+ (A) 08/28/2015 0240   BILIRUBINUR NEGATIVE 08/28/2015 0240   KETONESUR NEGATIVE 08/28/2015 0240   PROTEINUR 30 (A) 08/28/2015 0240   NITRITE NEGATIVE 08/28/2015 0240   LEUKOCYTESUR NEGATIVE 08/28/2015 0240   Sepsis Labs: @LABRCNTIP (procalcitonin:4,lacticacidven:4)  )No results found for this or any previous visit (from the past 240 hour(s)).       Radiology Studies: DG Chest 2 View  Result Date: 08/05/2019 CLINICAL DATA:  Persistent mid chest pain, cough and shortness of breath. Diagnosed with COVID-19 infection 2 weeks ago. EXAM: CHEST - 2 VIEW COMPARISON:  Radiographs 07/30/2017 and 11/02/2015. CT 07/25/2015. FINDINGS: The heart size and mediastinal contours are stable. There is mild aortic atherosclerosis. There is stable chronic opacity at the right lung base associated with tenting of the hemidiaphragm, likely reflecting scarring. There is new right upper lobe density which appears anteriorly located on the lateral view and may reflect residual inflammation or postinflammatory scarring. There is no pleural effusion or pneumothorax. The bones appear unchanged. IMPRESSION: 1. New right upper  lobe density consistent with residual inflammation or postinflammatory scarring. Chest radiographic follow-up in 4-6 weeks recommended. 2. Stable chronic right basilar opacity consistent with scarring. Electronically Signed   By: Richardean Sale M.D.   On: 08/05/2019 14:53   CT Angio Chest PE W/Cm &/Or Wo Cm  Result Date: 08/05/2019 CLINICAL DATA:  Pleuritic chest pain elevated D-dimer history of COVID EXAM: CT ANGIOGRAPHY CHEST WITH CONTRAST TECHNIQUE: Multidetector CT imaging of the chest was performed using the standard protocol during bolus administration of intravenous contrast. Multiplanar CT image reconstructions and MIPs were obtained to evaluate the vascular anatomy. CONTRAST:  85mL OMNIPAQUE IOHEXOL 350 MG/ML SOLN COMPARISON:  Chest x-ray 08/05/2019, CT chest 08/28/2015 FINDINGS: Cardiovascular: Satisfactory opacification of the pulmonary arteries to the segmental level. Small filling defects within distal segmental and subsegmental right lower lobe pulmonary vessels, series 4 image number 53 through 67. No other discrete filling defects are visualized. Nonaneurysmal aorta. Normal heart size. No pericardial effusion Mediastinum/Nodes: Midline trachea.  No thyroid mass. Borderline AP window lymph nodes measuring up to 12 mm. Small right hilar nodes measuring up to 13 mm. Esophagus unremarkable Lungs/Pleura: Patchy bilateral consolidations and ground-glass density, mostly peripheral. Mild emphysema. 3 mm right upper lobe pulmonary nodule, series 6, image number 38. 4 mm right middle lobe pulmonary nodule, series 6, image number 43. No pleural effusion or pneumothorax. Upper Abdomen: No acute abnormality. Musculoskeletal: Old sternal fracture. No acute or suspicious osseous abnormality. Review of the MIP images confirms the above findings. IMPRESSION: 1. Positive for acute distal segmental and subsegmental PE in the right lower lobe. 2. Patchy bilateral mostly peripheral consolidations and ground-glass  densities, felt consistent with the history of COVID. 3. Emphysema 4. Small less than 5 mm right middle and right upper lobe pulmonary nodules. No follow-up needed if patient is low-risk (and has no known or suspected primary neoplasm). Non-contrast chest CT can be considered in 12 months if patient is high-risk. This recommendation follows the consensus statement: Guidelines for Management of Incidental Pulmonary Nodules Detected on CT Images: From the Fleischner Society 2017; Radiology 2017; 284:228-243. Critical Value/emergent results were called by telephone at the time of interpretation on 08/05/2019 at 6:15 pm to provider [facility] , who verbally acknowledged these results. Emphysema (ICD10-J43.9). Electronically Signed   By: Donavan Foil M.D.   On: 08/05/2019 18:15        Scheduled Meds: . dexamethasone  6 mg Oral Daily  . pantoprazole  40 mg Oral Daily  . rivaroxaban  15 mg Oral BID   Followed by  . [START ON 08/27/2019] rivaroxaban  20 mg Oral Daily   Continuous Infusions:    LOS: 1 day    Time spent: 35 minutes      Edwin Dada, MD [facility] 08/06/2019, 12:34 PM     Please page through [place]:  [email or web address] Contact charge nurse for password If 7PM-7AM, please contact night-coverage

## 2019-08-07 MED ORDER — OXYCODONE HCL 5 MG PO TABS: 5.0000 mg | ORAL_TABLET | Freq: Four times a day (QID) | ORAL | 0 refills | Status: DC | PRN

## 2019-08-07 MED ORDER — RIVAROXABAN 20 MG PO TABS: 20.0000 mg | ORAL_TABLET | Freq: Every day | ORAL | 1 refills | Status: DC

## 2019-08-07 MED ORDER — DEXAMETHASONE 2 MG PO TABS: ORAL_TABLET | ORAL | 0 refills | Status: DC

## 2019-08-07 MED ORDER — RIVAROXABAN 15 MG PO TABS: 15.0000 mg | ORAL_TABLET | Freq: Two times a day (BID) | ORAL | 0 refills | Status: DC

## 2019-08-07 NOTE — Discharge Summary (Signed)
Physician Discharge Summary  Justin Mckee [id number] DOB: 1955-07-30 DOA: 08/05/2019  PCP: Justin Hauser, DO  Admit date: 08/05/2019 Discharge date: 08/07/2019  Admitted From: Home  Disposition:  Home   Recommendations for Outpatient Follow-up:  1. Follow up with PCP Dr. Parks Mckee in 3 weeks after COVID isolation ends Mar 3rd  2. Dr. Parks Mckee: Please obtain CT chest in 12 months to follow-up incidental lung nodule     Home Health: None  Equipment/Devices: None  Discharge Condition: Fair  CODE STATUS: FULL Diet recommendation: Regular  Brief/Interim Summary: Justin Mckee is a 64 y.o. M with hx ProsCA s/p prostatectomy and recent COVID infection earlier this month who presented with chest pain acutely starting on day of admission.  Was sitting at home, developed right-sided chest pain.  In the ER, hemodynamically stable.  CTA obtained that showed right-sided distal segmental and subsegmental PE.  Started on heparin infusion.     PRINCIPAL HOSPITAL DIAGNOSIS: Acute pulmonary embolism    Discharge Diagnoses:   Acute pulmonary embolism The patient has been recovering from Covid, and has been relatively sedentary.  He developed some chest pain, and CT angiogram of the chest showed a distal segmental and subsegmental PE on the right side.    This is a first-time, provoked VTE.  He was started on heparin, this was transitioned to Xarelto.  He should take Xarelto for 3 months and then stop.  GERD Continue pantoprazole  Coronavirus infection resolving Patient developed headache, nasal congestion earlier this month.  Tested positive on Feb 10.  Isolated 10 days, symptoms of headache and congestion improved, but then cough worsened yesteray.  CT chest showed pneumonia.    Given his benign clinical course, utility of remdesivir was doubted.  He was given a short steroid taper.  Incidental lung nodule Former smoker.  Recommend repeat imaging in 12  months.        Discharge Instructions  Discharge Instructions    Discharge instructions   Complete by: As directed    From Dr. Loleta Mckee: You were admitted for a blood clot or "pulmonary embolism" You should take Xarelto for 3 months At first: Take Xarelto 15 mg twice dialy for the first 21 days (until March 18), then for the rest of the 3 months Take Xarelto 20 mg nightly  Go see Dr. Parks Mckee asap  If you have black and tarry bowel movements, stop Xarelto and call someone immediately   Increase activity slowly   Complete by: As directed      Allergies as of 08/07/2019      Reactions   Shellfish Allergy Hives      Medication List    STOP taking these medications   meloxicam 15 MG tablet Commonly known as: MOBIC     TAKE these medications   dexamethasone 2 MG tablet Commonly known as: DECADRON Take 6 mg (3 tabs) for 2 days then take 4 mg (2 tabs) for 2 days then take 2 mg (1 tab) for two days then stop.   diclofenac sodium 1 % Gel Commonly known as: VOLTAREN Apply 2 g topically 2 (two) times daily. For Right knee use   fluticasone 50 MCG/ACT nasal spray Commonly known as: FLONASE Place 2 sprays into both nostrils daily. May use seasonally   omeprazole 20 MG capsule Commonly known as: PRILOSEC Take 1 capsule (20 mg total) by mouth daily before breakfast.   oxyCODONE 5 MG immediate release tablet Commonly known as: Oxy IR/ROXICODONE Take 1 tablet (5 mg total) by  mouth every 6 (six) hours as needed for breakthrough pain.   Rivaroxaban 15 MG Tabs tablet Commonly known as: XARELTO Take 1 tablet (15 mg total) by mouth in the morning and at bedtime for 20 days.   rivaroxaban 20 MG Tabs tablet Commonly known as: XARELTO Take 1 tablet (20 mg total) by mouth daily with supper. Start taking on: August 27, 2019   sildenafil 20 MG tablet Commonly known as: REVATIO Take 1-5 pills about 30 min prior to sex. Start with 1 and increase as needed.      Follow-up  Information    Justin Hauser, DO. Go on 08/28/2019.   Specialty: Family Medicine Why: Appt. is at 10:40am  Contact information: [address] [place] [address] [phone]          Allergies  Allergen Reactions  . Shellfish Allergy Hives    Consultations:     Procedures/Studies: DG Chest 2 View  Result Date: 08/05/2019 CLINICAL DATA:  Persistent mid chest pain, cough and shortness of breath. Diagnosed with COVID-19 infection 2 weeks ago. EXAM: CHEST - 2 VIEW COMPARISON:  Radiographs 07/30/2017 and 11/02/2015. CT 07/25/2015. FINDINGS: The heart size and mediastinal contours are stable. There is mild aortic atherosclerosis. There is stable chronic opacity at the right lung base associated with tenting of the hemidiaphragm, likely reflecting scarring. There is new right upper lobe density which appears anteriorly located on the lateral view and may reflect residual inflammation or postinflammatory scarring. There is no pleural effusion or pneumothorax. The bones appear unchanged. IMPRESSION: 1. New right upper lobe density consistent with residual inflammation or postinflammatory scarring. Chest radiographic follow-up in 4-6 weeks recommended. 2. Stable chronic right basilar opacity consistent with scarring. Electronically Signed   By: Richardean Sale M.D.   On: 08/05/2019 14:53   CT Angio Chest PE W/Cm &/Or Wo Cm  Result Date: 08/05/2019 CLINICAL DATA:  Pleuritic chest pain elevated D-dimer history of COVID EXAM: CT ANGIOGRAPHY CHEST WITH CONTRAST TECHNIQUE: Multidetector CT imaging of the chest was performed using the standard protocol during bolus administration of intravenous contrast. Multiplanar CT image reconstructions and MIPs were obtained to evaluate the vascular anatomy. CONTRAST:  1mL OMNIPAQUE IOHEXOL 350 MG/ML SOLN COMPARISON:  Chest x-ray 08/05/2019, CT chest 08/28/2015 FINDINGS: Cardiovascular: Satisfactory opacification of the pulmonary arteries to the  segmental level. Small filling defects within distal segmental and subsegmental right lower lobe pulmonary vessels, series 4 image number 53 through 67. No other discrete filling defects are visualized. Nonaneurysmal aorta. Normal heart size. No pericardial effusion Mediastinum/Nodes: Midline trachea. No thyroid mass. Borderline AP window lymph nodes measuring up to 12 mm. Small right hilar nodes measuring up to 13 mm. Esophagus unremarkable Lungs/Pleura: Patchy bilateral consolidations and ground-glass density, mostly peripheral. Mild emphysema. 3 mm right upper lobe pulmonary nodule, series 6, image number 38. 4 mm right middle lobe pulmonary nodule, series 6, image number 43. No pleural effusion or pneumothorax. Upper Abdomen: No acute abnormality. Musculoskeletal: Old sternal fracture. No acute or suspicious osseous abnormality. Review of the MIP images confirms the above findings. IMPRESSION: 1. Positive for acute distal segmental and subsegmental PE in the right lower lobe. 2. Patchy bilateral mostly peripheral consolidations and ground-glass densities, felt consistent with the history of COVID. 3. Emphysema 4. Small less than 5 mm right middle and right upper lobe pulmonary nodules. No follow-up needed if patient is low-risk (and has no known or suspected primary neoplasm). Non-contrast chest CT can be considered in 12 months if patient  is high-risk. This recommendation follows the consensus statement: Guidelines for Management of Incidental Pulmonary Nodules Detected on CT Images: From the Fleischner Society 2017; Radiology 2017; 284:228-243. Critical Value/emergent results were called by telephone at the time of interpretation on 08/05/2019 at 6:15 pm to provider [facility] , who verbally acknowledged these results. Emphysema (ICD10-J43.9). Electronically Signed   By: Donavan Foil M.D.   On: 08/05/2019 18:15       Subjective: Still some mild right-sided chest pain.  Cough is improving with  dexamethasone overnight.  No fever, dyspnea, tachypnea.  Discharge Exam: Vitals:   08/07/19 0800 08/07/19 0900  BP: (!) 142/91   Pulse: 81 91  Resp: (!) 24 20  Temp:    SpO2: 96% 93%   Vitals:   08/07/19 0700 08/07/19 0725 08/07/19 0800 08/07/19 0900  BP:  (!) 143/97 (!) 142/91   Pulse: 82  81 91  Resp: (!) 29  (!) 24 20  Temp:  98.1 F (36.7 C)    TempSrc:  Oral    SpO2: 93%  96% 93%  Weight:      Height:        General: Pt is alert, awake, not in acute distress Cardiovascular: RRR, nl S1-S2, no murmurs appreciated.   No LE edema.   Respiratory: Normal respiratory rate and rhythm.  CTAB without rales or wheezes. Abdominal: Abdomen soft and non-tender.  No distension or HSM.   Neuro/Psych: Strength symmetric in upper and lower extremities.  Judgment and insight appear normal.   The results of significant diagnostics from this hospitalization (including imaging, microbiology, ancillary and laboratory) are listed below for reference.     Microbiology: No results found for this or any previous visit (from the past 240 hour(s)).   Labs: BNP (last 3 results) No results for input(s): BNP in the last 8760 hours. Basic Metabolic Panel: Recent Labs  Lab 08/05/19 1401 08/06/19 0438  NA 136 137  K 3.8 4.4  CL 101 102  CO2 23 26  GLUCOSE 91 105*  BUN 11 10  CREATININE 1.09 0.93  CALCIUM 9.3 9.4   Liver Function Tests: No results for input(s): AST, ALT, ALKPHOS, BILITOT, PROT, ALBUMIN in the last 168 hours. No results for input(s): LIPASE, AMYLASE in the last 168 hours. No results for input(s): AMMONIA in the last 168 hours. CBC: Recent Labs  Lab 08/05/19 1401 08/05/19 2100  WBC 9.5 9.4  HGB 13.1 13.0  HCT 39.3 40.1  MCV 89.1 92.2  PLT 640* 653*   Cardiac Enzymes: No results for input(s): CKTOTAL, CKMB, CKMBINDEX, TROPONINI in the last 168 hours. BNP: Invalid input(s): POCBNP CBG: No results for input(s): GLUCAP in the last 168 hours. D-Dimer No results  for input(s): DDIMER in the last 72 hours. Hgb A1c No results for input(s): HGBA1C in the last 72 hours. Lipid Profile No results for input(s): CHOL, HDL, LDLCALC, TRIG, CHOLHDL, LDLDIRECT in the last 72 hours. Thyroid function studies No results for input(s): TSH, T4TOTAL, T3FREE, THYROIDAB in the last 72 hours.  Invalid input(s): FREET3 Anemia work up No results for input(s): VITAMINB12, FOLATE, FERRITIN, TIBC, IRON, RETICCTPCT in the last 72 hours. Urinalysis    Component Value Date/Time   COLORURINE YELLOW (A) 08/28/2015 0240   APPEARANCEUR CLEAR (A) 08/28/2015 0240   LABSPEC 1.012 08/28/2015 0240   PHURINE 6.0 08/28/2015 0240   GLUCOSEU 50 (A) 08/28/2015 0240   HGBUR 2+ (A) 08/28/2015 0240   BILIRUBINUR NEGATIVE 08/28/2015 0240   KETONESUR NEGATIVE 08/28/2015 0240  PROTEINUR 30 (A) 08/28/2015 0240   NITRITE NEGATIVE 08/28/2015 0240   LEUKOCYTESUR NEGATIVE 08/28/2015 0240   Sepsis Labs Invalid input(s): PROCALCITONIN,  WBC,  LACTICIDVEN Microbiology No results found for this or any previous visit (from the past 240 hour(s)).   Time coordinating discharge: 35 minutes The [place] controlled substances registry was reviewed for this patient prior to filling the <5 days supply controlled substances script.      SIGNED:   Edwin Dada, MD  [facility] 08/07/2019, 4:50 PM

## 2019-08-07 NOTE — Progress Notes (Signed)
Pt d/c to home via significant other. VSS. IV removed intact. Education completed. [facility] paperwork given to pt. Oxycodone prescription given to pt. All questions answered.

## 2019-08-10 ENCOUNTER — Ambulatory Visit (INDEPENDENT_AMBULATORY_CARE_PROVIDER_SITE_OTHER): Payer: BC Managed Care – PPO | Admitting: Family Medicine

## 2019-08-10 ENCOUNTER — Encounter: Payer: Self-pay | Admitting: Family Medicine

## 2019-08-10 ENCOUNTER — Other Ambulatory Visit: Payer: Self-pay

## 2019-08-10 ENCOUNTER — Telehealth: Payer: Self-pay

## 2019-08-10 DIAGNOSIS — I2609 Other pulmonary embolism with acute cor pulmonale: Secondary | ICD-10-CM

## 2019-08-10 DIAGNOSIS — U071 COVID-19: Secondary | ICD-10-CM

## 2019-08-10 DIAGNOSIS — R911 Solitary pulmonary nodule: Secondary | ICD-10-CM

## 2019-08-10 NOTE — Telephone Encounter (Signed)
  Patient was recently discharged from the hospital on 08/07/2019  No TCM completed, patient does not qualify for TCM services due to insurance coverage/[employer]  Scheduled for HFU on 08/28/2019.

## 2019-08-10 NOTE — Progress Notes (Signed)
Virtual Visit via Telephone The purpose of this virtual visit is to provide medical care while limiting exposure to the novel coronavirus (COVID19) for both patient and office staff.  Consent was obtained for phone visit:  Yes.   Answered questions that patient had about telehealth interaction:  Yes.   I discussed the limitations, risks, security and privacy concerns of performing an evaluation and management service by telephone. I also discussed with the patient that there may be a patient responsible charge related to this service. The patient expressed understanding and agreed to proceed.  Patient Location: Home Provider Location: [facility])   ---------------------------------------------------------------------- Chief Complaint  Patient presents with  . Hospitalization Follow-up    covid positive    S: Reviewed CMA documentation. I have called patient and gathered additional HPI as follows:  HOSPITAL FOLLOW-UP VISIT  Hospital/Location: [facility] Date of Admission: 08/05/19 Date of Discharge: 08/07/19 Transitions of care telephone call: Completed by Tyler Aas LPN on [id number] but patient does not meet criteria for TCM coding due to insurance.  Reason for Admission: COVID19 pneumonia chest pain Primary (+Secondary) Diagnosis: COVID19 pneumonia  FOLLOW-UP  - Hospital H&P and Discharge Summary have been reviewed - Patient presents today about 3 days after recent hospitalization. Brief summary of recent course, patient had symptoms of chest pain, dyspnea and was hospitalized, recent COVID19 infection, had CTA showed subsegmental PE on R side. He was treated with anticoagulation on heparin and then transitioned to Xarelto oral med for 15mg  BID for first 21 days, thought to be provoked 1st PE episode, therefore he was advised only on anticoagulation for 3 months total.  - Today reports overall has done well after discharge. Symptoms of breathing is improving. Chest  pain has improved only occasional pain.  Additional history Initial COVID19 test was positive on 07/22/19. He did quarantine for this and was treated appropriately and this has improved. He was cleared to return after 14 days, but now with hospitalization and blood clot, needed longer time out for recovery from PE. He will need a note to return to work  He has occasional cough at times. Takes cold medicine as needed.  - New medications on discharge: Xarelto, Dexamethasone   I have reviewed the discharge medication list, and have reconciled the current and discharge medications today.  Denies any fevers, chills, sweats, body ache, shortness of breath, sinus pain or pressure, headache, abdominal pain, diarrhea  -------------------------------------------------------------------------- O: No physical exam performed due to remote telephone encounter.  CLINICAL DATA:  Pleuritic chest pain elevated D-dimer history of COVID  EXAM: CT ANGIOGRAPHY CHEST WITH CONTRAST  TECHNIQUE: Multidetector CT imaging of the chest was performed using the standard protocol during bolus administration of intravenous contrast. Multiplanar CT image reconstructions and MIPs were obtained to evaluate the vascular anatomy.  CONTRAST:  38mL OMNIPAQUE IOHEXOL 350 MG/ML SOLN  COMPARISON:  Chest x-ray 08/05/2019, CT chest 08/28/2015  FINDINGS: Cardiovascular: Satisfactory opacification of the pulmonary arteries to the segmental level. Small filling defects within distal segmental and subsegmental right lower lobe pulmonary vessels, series 4 image number 53 through 67. No other discrete filling defects are visualized. Nonaneurysmal aorta. Normal heart size. No pericardial effusion  Mediastinum/Nodes: Midline trachea. No thyroid mass. Borderline AP window lymph nodes measuring up to 12 mm. Small right hilar nodes measuring up to 13 mm. Esophagus unremarkable  Lungs/Pleura: Patchy bilateral  consolidations and ground-glass density, mostly peripheral. Mild emphysema. 3 mm right upper lobe pulmonary nodule, series 6, image number 38. 4  mm right middle lobe pulmonary nodule, series 6, image number 43. No pleural effusion or pneumothorax.  Upper Abdomen: No acute abnormality.  Musculoskeletal: Old sternal fracture. No acute or suspicious osseous abnormality.  Review of the MIP images confirms the above findings.  IMPRESSION: 1. Positive for acute distal segmental and subsegmental PE in the right lower lobe. 2. Patchy bilateral mostly peripheral consolidations and ground-glass densities, felt consistent with the history of COVID. 3. Emphysema 4. Small less than 5 mm right middle and right upper lobe pulmonary nodules. No follow-up needed if patient is low-risk (and has no known or suspected primary neoplasm). Non-contrast chest CT can be considered in 12 months if patient is high-risk. This recommendation follows the consensus statement: Guidelines for Management of Incidental Pulmonary Nodules Detected on CT Images: From the Fleischner Society 2017; Radiology 2017; 284:228-243.  Critical Value/emergent results were called by telephone at the time of interpretation on 08/05/2019 at 6:15 pm to provider [facility] , who verbally acknowledged these results.  Emphysema (ICD10-J43.9).   Electronically Signed   By: Donavan Foil M.D.   On: 08/05/2019 18:15 -------------------------------------------------------------------------- A&P:  #COVID19 RESOLVED - Finishing Dexamethasone taper - Completed his quarantine, able to return to work when ready clinically - Has residual cough, afebrile.  #Acute PE, 1st episode, provoked by COVID - Now resolving s/p IV heparin anticoagulation and transition to Xarelto regimen in hospital - Continue current Xarelto 15mg  BID for 21 days, has about 2.5 weeks left then he can switch to Xarelto 20mg  daily, contact us in advance  prior to new rx so we can order rx. Duration of therapy is 3 months to complete it, for now, due to 1st episode only.  Work note written, to be picked up by patient from our office tomorrow. Return on 08/17/19 as advised by hospital  #Incidental lung finding R sided middle / upper lobe small < 5 mm Incidental he has had prior imaging in past several years with nodule Will proceed with surveillance in future, anticipate in 1 year as recommended will repeat, can refer to Pulm Nodule clinic   No orders of the defined types were placed in this encounter.   If symptoms do not resolve or significantly improve OR if WORSENING - fever / cough - or worsening shortness of breath - then should contact us and seek advice on next steps in treatment at home vs where/when to seek care at Urgent Care or Hospital ED for further intervention and possible testing if indicated.  Patient verbalizes understanding with the above medical recommendations including the limitation of remote medical advice.  Specific follow-up / call-back criteria were given for patient to follow-up or seek medical care more urgently if needed.   - Time spent in direct consultation with patient on phone: 15 minutes   Nobie Putnam, [facility] 08/10/2019, 2:55 PM

## 2019-08-17 ENCOUNTER — Telehealth: Payer: Self-pay | Admitting: Family Medicine

## 2019-08-17 ENCOUNTER — Encounter: Payer: Self-pay | Admitting: Family Medicine

## 2019-08-17 NOTE — Telephone Encounter (Signed)
He wants to return to work on Monday March 15.

## 2019-08-17 NOTE — Telephone Encounter (Signed)
Last work note written on 08/10/19  Excused from work starting 08/05/19 through 08/16/19.  He was anticipated to return on Monday 08/17/19 (today)  Can you clarify which dates he needs new note to say now?  - Out 08/05/19 through Sunday March 14?  - Return to work on Monday March 15?  Nobie Putnam, [facility] 08/17/2019, 1:09 PM

## 2019-08-17 NOTE — Telephone Encounter (Signed)
New work note written.  Patient to pick up tomorrow 3/9 in office  Justin Mckee, [facility] 08/17/2019, 1:27 PM

## 2019-08-17 NOTE — Telephone Encounter (Signed)
Pt. said that he was only schedule to work 2 days 12 hr a day for this week,pt was still having headaches.He want to know if he could go back on Monday with a new work note. Pt call back  # is  [phone]

## 2019-08-21 ENCOUNTER — Telehealth: Payer: Self-pay | Admitting: Family Medicine

## 2019-08-21 DIAGNOSIS — I2609 Other pulmonary embolism with acute cor pulmonale: Secondary | ICD-10-CM

## 2019-08-21 MED ORDER — RIVAROXABAN 20 MG PO TABS: 20.0000 mg | ORAL_TABLET | Freq: Every day | ORAL | 1 refills | Status: DC

## 2019-08-21 NOTE — Telephone Encounter (Signed)
Pt is requesting refill on Xarelto 20 MG  [facility]  Justin Mend

## 2019-08-21 NOTE — Telephone Encounter (Signed)
Ordered Xarelto 20mg  daily with supper, 30 pills + 1 refill for finishing 3 month total (already finished first round of treatment)  Nobie Putnam, [facility] 08/21/2019, 1:07 PM

## 2019-08-24 ENCOUNTER — Other Ambulatory Visit: Payer: Self-pay

## 2019-08-28 ENCOUNTER — Ambulatory Visit: Payer: BC Managed Care – PPO | Admitting: Family Medicine

## 2019-08-31 ENCOUNTER — Encounter: Payer: Self-pay | Admitting: Family Medicine

## 2019-08-31 ENCOUNTER — Other Ambulatory Visit: Payer: Self-pay

## 2019-08-31 ENCOUNTER — Telehealth: Payer: Self-pay | Admitting: *Deleted

## 2019-08-31 ENCOUNTER — Ambulatory Visit (INDEPENDENT_AMBULATORY_CARE_PROVIDER_SITE_OTHER): Payer: BC Managed Care – PPO | Admitting: Family Medicine

## 2019-08-31 VITALS — BP 146/95 | HR 73 | Temp 97.1°F | Resp 16 | Ht 70.0 in | Wt 195.0 lb

## 2019-08-31 DIAGNOSIS — I10 Essential (primary) hypertension: Secondary | ICD-10-CM

## 2019-08-31 DIAGNOSIS — I2699 Other pulmonary embolism without acute cor pulmonale: Secondary | ICD-10-CM

## 2019-08-31 DIAGNOSIS — R7303 Prediabetes: Secondary | ICD-10-CM

## 2019-08-31 DIAGNOSIS — R911 Solitary pulmonary nodule: Secondary | ICD-10-CM

## 2019-08-31 DIAGNOSIS — E663 Overweight: Secondary | ICD-10-CM | POA: Diagnosis not present

## 2019-08-31 LAB — POCT GLYCOSYLATED HEMOGLOBIN (HGB A1C): Hemoglobin A1C: 6.6 % — AB (ref 4.0–5.6)

## 2019-08-31 MED ORDER — AMLODIPINE BESYLATE 10 MG PO TABS: 10.0000 mg | ORAL_TABLET | Freq: Every day | ORAL | 2 refills | Status: DC

## 2019-08-31 NOTE — Assessment & Plan Note (Signed)
Elevated A1c up to 6.6, prior 6.5, this is likely due to dexamethasone from covid19 treatment recently And some poor lifestyle  Plan:  1. Not on any therapy currently 2. Encourage improved lifestyle - low carb, low sugar diet, reduce portion size, continue improving regular exercise  Defer diagnosis DM for 3 months, repeat A1c once more

## 2019-08-31 NOTE — Telephone Encounter (Signed)
Received referral for low dose lung cancer screening CT scan. Message left at phone number listed in EMR for patient to call me back to facilitate scheduling scan.  

## 2019-08-31 NOTE — Progress Notes (Signed)
Subjective:    Patient ID: Justin Mckee, male    DOB: [birth year], 64 y.o.   MRN: [id number]  [name] is a 64 y.o. male presenting on 08/31/2019 for Hypertension   HPI   Pre-Diabetes/ Overweight BMI >27 History of elevated A1c up to 6.5 in past. Previously 6.2 range. Today due for A1c Meds: never on meds Lifestyle - Weight up 5 lbs - Diet (admits some sweets, not adhering always to diet) - Limited activity lately, he has had some headaches post COVID19 syndrome, tried Tylenol Ext Str PRN without significant relief. Denies hypoglycemia, polyuria  Follow-up Acute PE, provoked, 1st Recent history Acute PE Now finished Xarelto 15 BID, now he just started on Xarelto 20mg  daily, has 2 months to finish his course  Hypertension, new diagnosis Prior history monitoring BP, has had episodic elevated readings, home readings 130-150 Current Meds - None  Improving lifestyle Admits headaches Denies CP, dyspnea, edema, dizziness / lightheadedness  RUL Pulm nodule Incidental finding abnormal pulmonary nodule, < 43mm RUL, former smoker. He is asymptomatic currently Interested in repeat scan in 1 year.  Depression screen [facility] 2/9 08/31/2019 08/10/2019 03/02/2019  Decreased Interest 0 0 0  Down, Depressed, Hopeless 0 0 0  PHQ - 2 Score 0 0 0  Altered sleeping - - -  Tired, decreased energy - - -  Change in appetite - - -  Feeling bad or failure about yourself  - - -  Trouble concentrating - - -  Moving slowly or fidgety/restless - - -  Suicidal thoughts - - -  PHQ-9 Score - - -  Difficult doing work/chores - - -    Social History   Tobacco Use  . Smoking status: Former Smoker    Packs/day: 1.00    Years: 40.00    Pack years: 40.00    Types: Cigarettes    Quit date: 06/01/2016    Years since quitting: 3.2  . Smokeless tobacco: Former Systems developer    Quit date: 08/28/2015  Substance Use Topics  . Alcohol use: Yes    Alcohol/week: 24.0 standard drinks    Types: 24 Cans of beer per week      Comment:    . Drug use: No    Review of Systems Per HPI unless specifically indicated above     Objective:    BP (!) 146/95   Pulse 73   Temp (!) 97.1 F (36.2 C) (Temporal)   Resp 16   Ht 5\' 10"  (1.778 m)   Wt 195 lb (88.5 kg)   BMI 27.98 kg/m   Wt Readings from Last 3 Encounters:  08/31/19 195 lb (88.5 kg)  08/05/19 190 lb (86.2 kg)  08/01/19 190 lb (86.2 kg)    Physical Exam Vitals and nursing note reviewed.  Constitutional:      General: He is not in acute distress.    Appearance: He is well-developed. He is not diaphoretic.     Comments: Well-appearing, comfortable, cooperative  HENT:     Head: Normocephalic and atraumatic.  Eyes:     General:        Right eye: No discharge.        Left eye: No discharge.     Conjunctiva/sclera: Conjunctivae normal.  Neck:     Thyroid: No thyromegaly.  Cardiovascular:     Rate and Rhythm: Normal rate and regular rhythm.     Heart sounds: Normal heart sounds. No murmur.  Pulmonary:     Effort: Pulmonary effort is  normal. No respiratory distress.     Breath sounds: Normal breath sounds. No wheezing or rales.  Musculoskeletal:        General: Normal range of motion.     Cervical back: Normal range of motion and neck supple.  Lymphadenopathy:     Cervical: No cervical adenopathy.  Skin:    General: Skin is warm and dry.     Findings: No erythema or rash.  Neurological:     Mental Status: He is alert and oriented to person, place, and time.  Psychiatric:        Behavior: Behavior normal.     Comments: Well groomed, good eye contact, normal speech and thoughts      I have personally reviewed the radiology report from 08/05/19.  CLINICAL DATA:  Pleuritic chest pain elevated D-dimer history of COVID  EXAM: CT ANGIOGRAPHY CHEST WITH CONTRAST  TECHNIQUE: Multidetector CT imaging of the chest was performed using the standard protocol during bolus administration of intravenous contrast. Multiplanar CT image  reconstructions and MIPs were obtained to evaluate the vascular anatomy.  CONTRAST:  45mL OMNIPAQUE IOHEXOL 350 MG/ML SOLN  COMPARISON:  Chest x-ray 08/05/2019, CT chest 08/28/2015  FINDINGS: Cardiovascular: Satisfactory opacification of the pulmonary arteries to the segmental level. Small filling defects within distal segmental and subsegmental right lower lobe pulmonary vessels, series 4 image number 53 through 67. No other discrete filling defects are visualized. Nonaneurysmal aorta. Normal heart size. No pericardial effusion  Mediastinum/Nodes: Midline trachea. No thyroid mass. Borderline AP window lymph nodes measuring up to 12 mm. Small right hilar nodes measuring up to 13 mm. Esophagus unremarkable  Lungs/Pleura: Patchy bilateral consolidations and ground-glass density, mostly peripheral. Mild emphysema. 3 mm right upper lobe pulmonary nodule, series 6, image number 38. 4 mm right middle lobe pulmonary nodule, series 6, image number 43. No pleural effusion or pneumothorax.  Upper Abdomen: No acute abnormality.  Musculoskeletal: Old sternal fracture. No acute or suspicious osseous abnormality.  Review of the MIP images confirms the above findings.  IMPRESSION: 1. Positive for acute distal segmental and subsegmental PE in the right lower lobe. 2. Patchy bilateral mostly peripheral consolidations and ground-glass densities, felt consistent with the history of COVID. 3. Emphysema 4. Small less than 5 mm right middle and right upper lobe pulmonary nodules. No follow-up needed if patient is low-risk (and has no known or suspected primary neoplasm). Non-contrast chest CT can be considered in 12 months if patient is high-risk. This recommendation follows the consensus statement: Guidelines for Management of Incidental Pulmonary Nodules Detected on CT Images: From the Fleischner Society 2017; Radiology 2017; 284:228-243.  Critical Value/emergent results were  called by telephone at the time of interpretation on 08/05/2019 at 6:15 pm to provider [facility] , who verbally acknowledged these results.  Emphysema (ICD10-J43.9).   Electronically Signed   By: Donavan Foil M.D.   On: 08/05/2019 18:15  Results for orders placed or performed in visit on 08/31/19  POCT glycosylated hemoglobin (Hb A1C)  Result Value Ref Range   Hemoglobin A1C 6.6 (A) 4.0 - 5.6 %      Assessment & Plan:   Problem List Items Addressed This Visit    Pulmonary embolism ([place])    Stable without recurrence or new complication Completed initial month of xarelto 15mg  BID Now on Xarelto 20mg  daily, has essentially 2 months to finish then completed 3 month therapy, should finish by mid May 2021      Relevant Medications   amLODipine (NORVASC) 10  MG tablet   Pre-diabetes - Primary    Elevated A1c up to 6.6, prior 6.5, this is likely due to dexamethasone from covid19 treatment recently And some poor lifestyle  Plan:  1. Not on any therapy currently 2. Encourage improved lifestyle - low carb, low sugar diet, reduce portion size, continue improving regular exercise  Defer diagnosis DM for 3 months, repeat A1c once more      Relevant Orders   POCT glycosylated hemoglobin (Hb A1C) (Completed)   Overweight (BMI 25.0-29.9)    Goal for weight loss      Essential hypertension    Mildly elevated initial BP New diagnosis HTN, associated with headache - Home BP readings elevated   No known complications     Plan:  1. New start anti HTN therapy - Amlodipine CCB 5-10mg  - start 10mg  tabs rx - he can cut in half for dose 5mg  daily for first 1-2 weeks if needed can inc to 1 whole tablet 10mg  daily 2. Encourage improved lifestyle - low sodium diet, regular exercise 3. Continue monitor BP outside office, bring readings to next visit, if persistently >140/90 or new symptoms notify office sooner 4. Follow-up 3 months       Relevant Medications   amLODipine (NORVASC)  10 MG tablet    Other Visit Diagnoses    Right upper lobe pulmonary nodule       Relevant Orders   AMB  Referral to Pulmonary Nodule Clinic      Reviewed pulm nodule Will refer to Pulm Nodule clinic he agrees for repeat CT in 1 year  Orders Placed This Encounter  Procedures  . AMB  Referral to Pulmonary Nodule Clinic    Referral Priority:   Routine    Referral Type:   Consultation    Referral Reason:   Specialty Services Required    Number of Visits Requested:   1  . POCT glycosylated hemoglobin (Hb A1C)     Meds ordered this encounter  Medications  . amLODipine (NORVASC) 10 MG tablet    Sig: Take 1 tablet (10 mg total) by mouth daily. May start with half pill (5mg  dose) daily in morning, can increase up to 1 whole pill if needed, if BP >140/90 and headaches    Dispense:  30 tablet    Refill:  2      Follow up plan: Return in about 3 months (around 12/01/2019) for Pre DM A1c, HTN, Headaches.  Refer to [facility] Nodule clinic schedule CT for next year 08/2020   Nobie Putnam, [facility] 08/31/2019, 8:18 AM

## 2019-08-31 NOTE — Assessment & Plan Note (Addendum)
Mildly elevated initial BP New diagnosis HTN, associated with headache - Home BP readings elevated   No known complications     Plan:  1. New start anti HTN therapy - Amlodipine CCB 5-10mg  - start 10mg  tabs rx - he can cut in half for dose 5mg  daily for first 1-2 weeks if needed can inc to 1 whole tablet 10mg  daily 2. Encourage improved lifestyle - low sodium diet, regular exercise 3. Continue monitor BP outside office, bring readings to next visit, if persistently >140/90 or new symptoms notify office sooner 4. Follow-up 3 months

## 2019-08-31 NOTE — Assessment & Plan Note (Signed)
Stable without recurrence or new complication Completed initial month of xarelto 15mg  BID Now on Xarelto 20mg  daily, has essentially 2 months to finish then completed 3 month therapy, should finish by mid May 2021

## 2019-08-31 NOTE — Patient Instructions (Addendum)
Thank you for coming to the office today.  BP elevated Start new BP med Amlodipine 10mg  tablet, START CUT IN HALF for 5mg  once daily every morning. Check BP few times a week, goal < 140/90. If not at goal, or still have headaches, can INCREASE to 1 whole pill daily  Recent Labs    02/25/19 0829 08/31/19 0828  HGBA1C 6.2* 6.6*   Goal to lower A1c blood sugar, likely from steroid recently. Try to limit sweets sugars starches, drink plenty of water and we can re-check this in 3 months. Goal is < 6.5 to avoid diabetes.  They will call to schedule CT scan again 1 year.  Finish last 2 months of Xarelto   Please schedule a Follow-up Appointment to: Return in about 3 months (around 12/01/2019) for Pre DM A1c, HTN, Headaches.  If you have any other questions or concerns, please feel free to call the office or send a message through [place]. You may also schedule an earlier appointment if necessary.  Additionally, you may be receiving a survey about your experience at our office within a few days to 1 week by e-mail or mail. We value your feedback.  Nobie Putnam, DO [place]

## 2019-08-31 NOTE — Assessment & Plan Note (Signed)
Goal for weight loss

## 2019-09-03 ENCOUNTER — Other Ambulatory Visit: Payer: Self-pay | Admitting: Oncology

## 2019-09-03 DIAGNOSIS — R918 Other nonspecific abnormal finding of lung field: Secondary | ICD-10-CM

## 2019-09-03 NOTE — Progress Notes (Signed)
  Pulmonary Nodule Clinic Telephone Note [place]   Received referral from Dr. Parks Ranger (PCP).  HPI: Justin Mckee is a 64 year old male with history of prostate cancer status post prostatectomy and recent Covid infection who presented to the emergency room last month for pleuritic chest pain.  Imaging revealed a distal segmental and subsegmental PE on the right side.  He was started on heparin and transition to Xarelto at discharge.  Imaging also revealed a 5 mm right middle and right upper lobe pulmonary nodule.  It was recommended he have a repeat chest CT in approximately 12 months.  Review and Recommendations: I personally reviewed all patient's previous imaging including CT angio from February 2021.  I recommend follow-up with non-contrast chest CT scan of the chest in February 2022.  Social History: Patient is a former smoker.  Has history of smoking 1 pack of cigarettes per day for 40 years.  Patient quit in March 2017.  High risk factors include: History of heavy smoking, exposure to asbestos, radium or uranium, personal family history of lung cancer, older age, sex (females greater than males), race (black and native [place] greater than weight), marginal speculation, upper lobe location, multiplicity (less than 5 nodules increases risk for malignancy) and emphysema and/or pulmonary fibrosis.   This recommendation follows the consensus statement: Guidelines for Management of Incidental Pulmonary Nodules Detected on CT Images: From the Fleischner Society 2017; Radiology 2017; 284:228-243.    I have placed order for CT scan without contrast to be completed approximately 12 months from previous.  Disposition: Order placed for repeat CT chest. Will notify [place] in scheduling. [place] to call patient with appointment date and time. Return to pulmonary nodule clinic a few days after his repeat imaging to discuss results and plan moving forward.  Faythe Casa, NP 09/03/2019 2:53 PM

## 2019-09-07 ENCOUNTER — Telehealth: Payer: Self-pay | Admitting: Family Medicine

## 2019-09-07 NOTE — Telephone Encounter (Signed)
Pt. Justin Mckee that he woke up with a headache today,per pt he was told come back when he was able to work a full day, he is requesting a letter to be out of work ALL week because of a short week at work and return on  Monday the April  5th . Pt call back # is [phone]

## 2019-09-07 NOTE — Telephone Encounter (Signed)
Patient advised as per Dr K. 

## 2019-09-07 NOTE — Telephone Encounter (Signed)
Note written for this week now out until 09/14/19  If he is not improving by end of week, he should schedule follow-up prior to Monday 09/14/19 so we can address the issue.  Nobie Putnam, [facility] 09/07/2019, 11:53 AM

## 2019-09-15 ENCOUNTER — Telehealth: Payer: Self-pay | Admitting: Family Medicine

## 2019-09-15 ENCOUNTER — Other Ambulatory Visit: Payer: Self-pay | Admitting: Family Medicine

## 2019-09-15 ENCOUNTER — Other Ambulatory Visit: Payer: Self-pay

## 2019-09-15 ENCOUNTER — Ambulatory Visit (INDEPENDENT_AMBULATORY_CARE_PROVIDER_SITE_OTHER): Payer: BC Managed Care – PPO | Admitting: Family Medicine

## 2019-09-15 ENCOUNTER — Encounter: Payer: Self-pay | Admitting: Family Medicine

## 2019-09-15 VITALS — BP 128/76 | HR 76 | Temp 97.8°F | Ht 70.0 in | Wt 192.4 lb

## 2019-09-15 DIAGNOSIS — I2609 Other pulmonary embolism with acute cor pulmonale: Secondary | ICD-10-CM

## 2019-09-15 DIAGNOSIS — I10 Essential (primary) hypertension: Secondary | ICD-10-CM

## 2019-09-15 DIAGNOSIS — R519 Headache, unspecified: Secondary | ICD-10-CM | POA: Diagnosis not present

## 2019-09-15 DIAGNOSIS — G8929 Other chronic pain: Secondary | ICD-10-CM

## 2019-09-15 DIAGNOSIS — I2699 Other pulmonary embolism without acute cor pulmonale: Secondary | ICD-10-CM

## 2019-09-15 MED ORDER — GABAPENTIN 100 MG PO CAPS: ORAL_CAPSULE | ORAL | 1 refills | Status: DC

## 2019-09-15 NOTE — Progress Notes (Addendum)
Subjective:    Patient ID: Justin Mckee, male    DOB: Aug 20, 1955, 64 y.o.   MRN: [id number]  Justin Mckee is a 64 y.o. male presenting on 09/15/2019 for Headache (and earache x2 months both on right side )   HPI   Recurrent Headaches R sided / Ear Pain R side Chronic Hypertension - Last visit with me 08/31/19, for same problem, treated with new anti HTN med Amlodipine due to elevated BP thought causing headaches, see prior notes for background information. - Interval update with now on Amlodipine doing better, BP is controlled but still has same R sided episodic headache - Today patient reports concern with R headaches. Seems episodic has throughout the day, R sided only throbbing aching pain but no other significant features. He also has R ear pain as well, he is on Flonase but not helping. Not endorsing other sinus symptoms. - He does not feel able to return to work due to headache at this time, but overall feeling somewhat better from prior PE hospitalization. - He takes Tylenol PRN. Cannot take NSAID due to anticoagulation. - He is on FMLA due to this condition and has not returned to work Denies CP, dyspnea, edema, dizziness / lightheadedness, numbness weakness, hearing loss  PE On Anticoagulation with Xarelto now 20mg  daily. Anticipate about 6 more weeks until completed. No concerns or recurrence.  Depression screen [facility] 2/9 08/31/2019 08/10/2019 03/02/2019  Decreased Interest 0 0 0  Down, Depressed, Hopeless 0 0 0  PHQ - 2 Score 0 0 0  Altered sleeping - - -  Tired, decreased energy - - -  Change in appetite - - -  Feeling bad or failure about yourself  - - -  Trouble concentrating - - -  Moving slowly or fidgety/restless - - -  Suicidal thoughts - - -  PHQ-9 Score - - -  Difficult doing work/chores - - -    Social History   Tobacco Use  . Smoking status: Former Smoker    Packs/day: 1.00    Years: 40.00    Pack years: 40.00    Types: Cigarettes    Quit date: 06/01/2016   Years since quitting: 3.2  . Smokeless tobacco: Former Systems developer    Quit date: 08/28/2015  Substance Use Topics  . Alcohol use: Yes    Alcohol/week: 24.0 standard drinks    Types: 24 Cans of beer per week    Comment:    . Drug use: No    Review of Systems Per HPI unless specifically indicated above     Objective:    BP 128/76   Pulse 76   Temp 97.8 F (36.6 C) (Temporal)   Ht 5\' 10"  (1.778 m)   Wt 192 lb 6.4 oz (87.3 kg)   SpO2 98%   BMI 27.61 kg/m   Wt Readings from Last 3 Encounters:  09/15/19 192 lb 6.4 oz (87.3 kg)  08/31/19 195 lb (88.5 kg)  08/05/19 190 lb (86.2 kg)    Physical Exam Vitals and nursing note reviewed.  Constitutional:      General: He is not in acute distress.    Appearance: He is well-developed. He is not diaphoretic.     Comments: Well-appearing, comfortable, cooperative  HENT:     Head: Normocephalic and atraumatic.     Comments: Bilateral TM normal, except slight cloudy effusion R>L but no fullness or bulging TM. No erythema. Eyes:     General:        Right  eye: No discharge.        Left eye: No discharge.     Conjunctiva/sclera: Conjunctivae normal.  Cardiovascular:     Rate and Rhythm: Normal rate and regular rhythm.     Heart sounds: No murmur.  Pulmonary:     Effort: Pulmonary effort is normal.     Breath sounds: Normal breath sounds.  Skin:    General: Skin is warm and dry.     Findings: No erythema or rash.  Neurological:     Mental Status: He is alert and oriented to person, place, and time. Mental status is at baseline.     Cranial Nerves: No cranial nerve deficit.     Sensory: No sensory deficit.     Motor: No weakness.     Coordination: Coordination normal.  Psychiatric:        Behavior: Behavior normal.     Comments: Well groomed, good eye contact, normal speech and thoughts    Results for orders placed or performed in visit on 08/31/19  POCT glycosylated hemoglobin (Hb A1C)  Result Value Ref Range   Hemoglobin A1C 6.6  (A) 4.0 - 5.6 %      Assessment & Plan:   Problem List Items Addressed This Visit    Pulmonary embolism ([place])    Stable without recurrence or new complication Completed initial month of xarelto 15mg  BID on Xarelto 20mg  daily, has essentially 2 months to finish then completed 3 month therapy, should finish by mid May 2021      Essential hypertension - Primary    Significantly improved BP controlled on CCB. Still has Amlodipine - Home BP readings elevated   No known complications     Plan:  1. Continues on Amlodipine 10mg  daily 2. Encourage improved lifestyle - low sodium diet, regular exercise 3. Continue monitor BP outside office, bring readings to next visit, if persistently >140/90 or new symptoms notify office sooner       Other Visit Diagnoses    Chronic right-sided headaches       Relevant Medications   gabapentin (NEURONTIN) 100 MG capsule   Other Relevant Orders   Ambulatory referral to Neurology      #R Sided headache Clinically uncertain cause without other significant or characteristic features Seems chronic and episodic now, some assoc w ear but mostly unremarkable ear history, some mild effusion on flonase already no other allergic symptoms No neurological deficits Not consistent with migraine Cannot take NSAID  Possibly secondary to [place] from 07/2019  Given recent PE and concerns of patient - will refer to Neuro for further diagnostic evaluation - may warrant other imaging or management of headaches  Trial on Gabapentin dosing as instructed May increase Tylenol PRN  Remain out of work for anticipated another 4 weeks approx, pending Neurology consultation. On short term disability currently out of work.   Orders Placed This Encounter  Procedures  . Ambulatory referral to Neurology    Referral Priority:   Routine    Referral Type:   Consultation    Referral Reason:   Specialty Services Required    Requested Specialty:   Neurology    Number of  Visits Requested:   1      Meds ordered this encounter  Medications  . gabapentin (NEURONTIN) 100 MG capsule    Sig: Start 1 capsule daily, increase by 1 cap every 2-3 days as tolerated up to 3 times a day, or may take 3 at once in evening.  Dispense:  90 capsule    Refill:  1      Follow up plan: Return in about 4 weeks (around 10/13/2019), or if symptoms worsen or fail to improve, for 4 weeks for headache HTN.   Nobie Putnam, [facility] 09/15/2019, 9:33 AM

## 2019-09-15 NOTE — Assessment & Plan Note (Signed)
Stable without recurrence or new complication Completed initial month of xarelto 15mg  BID on Xarelto 20mg  daily, has essentially 2 months to finish then completed 3 month therapy, should finish by mid May 2021

## 2019-09-15 NOTE — Patient Instructions (Addendum)
Thank you for coming to the office today.  BP is controlled. Not causing headache it seems.  Recommend to start taking Tylenol Extra Strength 500mg  tabs - take 1 to 2 tabs per dose (max 1000mg ) every 6-8 hours for pain (take regularly, don't skip a dose for next 7 days), max 24 hour daily dose is 6 tablets or 3000mg . In the future you can repeat the same everyday Tylenol course for 1-2 weeks at a time.   Start Gabapentin 100mg  capsules, take at night for 2-3 nights only, and then increase to 2 times a day for a few days, and then may increase to 3 times a day, it may make you drowsy, if helps significantly at night only, then you can increase instead to 3 capsules at night, instead of 3 times a day - In the future if needed, we can significantly increase the dose if tolerated well, some common doses are 300mg  three times a day up to 600mg  three times a day, usually it takes several weeks or months to get to higher doses  Referral to [facility] Neurology  Call them in 1 week if not heard back  [facility] - Neurology Dept [place], [place] [address] Phone: [phone]   Please schedule a Follow-up Appointment to: Return in about 4 weeks (around 10/13/2019), or if symptoms worsen or fail to improve, for 4 weeks for headache HTN.  If you have any other questions or concerns, please feel free to call the office or send a message through [place]. You may also schedule an earlier appointment if necessary.  Additionally, you may be receiving a survey about your experience at our office within a few days to 1 week by e-mail or mail. We value your feedback.  Nobie Putnam, DO [place]

## 2019-09-15 NOTE — Telephone Encounter (Signed)
Spoke with Justin Mckee in [place]  He needs letter out until neurology apt TBD not scheduled yet  Letter written today.  See for details.  We can update letter once we have a scheduled date for neurology apt.  Will print sign and see if our office can email back to [place] at [email or web address]  [employer], [facility] 09/15/2019, 1:53 PM

## 2019-09-15 NOTE — Telephone Encounter (Signed)
Pt said that he need a letter to return to work.Marland KitchenMarland KitchenPer the lady in the office you will need to call Janett Billow in [place]  [phone] to clarify what needed to be in the letter.  (Pt. Is telling them that the will need to be out until he see a Neurology)

## 2019-09-15 NOTE — Assessment & Plan Note (Signed)
Significantly improved BP controlled on CCB. Still has Amlodipine - Home BP readings elevated   No known complications     Plan:  1. Continues on Amlodipine 10mg  daily 2. Encourage improved lifestyle - low sodium diet, regular exercise 3. Continue monitor BP outside office, bring readings to next visit, if persistently >140/90 or new symptoms notify office sooner

## 2019-09-21 ENCOUNTER — Encounter: Payer: Self-pay | Admitting: Family Medicine

## 2019-09-23 DIAGNOSIS — R519 Headache, unspecified: Secondary | ICD-10-CM | POA: Diagnosis not present

## 2019-09-23 DIAGNOSIS — Z8616 Personal history of COVID-19: Secondary | ICD-10-CM | POA: Diagnosis not present

## 2019-09-28 ENCOUNTER — Telehealth: Payer: Self-pay | Admitting: *Deleted

## 2019-09-28 NOTE — Telephone Encounter (Signed)
Referral received for pt to be seen in [facility] for further workup of incidental lung nodule with follow up CT scan. Left message with patient to inform of the clinic and review recommendations/upcoming appts including follow up CT scan and visit with Rulon Abide, NP to discuss results. Appts mailed to patient. Instructed to call back if needs to change/reschedule or if has any further questions.

## 2019-10-08 ENCOUNTER — Ambulatory Visit: Payer: Self-pay

## 2019-10-08 NOTE — Telephone Encounter (Signed)
Pt questioning if he is able to get 1st covid vaccine as he had covid in February 2021. Advised appropriate to receive as long as quarantine period is over and he is symptom free.  Pt verbalizes understanding. Offered to schedule, states he will get vaccine from workplace.  Reason for Disposition . Health Information question, no triage required and triager able to answer question  Answer Assessment - Initial Assessment Questions 1. REASON FOR CALL or QUESTION: "What is your reason for calling today?" or "How can I best help you?" or "What question do you have that I can help answer?"     *Covid vaccine questions  Protocols used: INFORMATION ONLY CALL - NO TRIAGE-A-AH

## 2019-10-17 ENCOUNTER — Other Ambulatory Visit: Payer: Self-pay | Admitting: Family Medicine

## 2019-10-17 DIAGNOSIS — I2609 Other pulmonary embolism with acute cor pulmonale: Secondary | ICD-10-CM

## 2019-11-19 ENCOUNTER — Other Ambulatory Visit: Payer: Self-pay

## 2019-11-19 ENCOUNTER — Ambulatory Visit (INDEPENDENT_AMBULATORY_CARE_PROVIDER_SITE_OTHER): Payer: BC Managed Care – PPO | Admitting: Family Medicine

## 2019-11-19 ENCOUNTER — Encounter: Payer: Self-pay | Admitting: Family Medicine

## 2019-11-19 ENCOUNTER — Other Ambulatory Visit: Payer: Self-pay | Admitting: Family Medicine

## 2019-11-19 VITALS — BP 121/70 | HR 86 | Temp 97.5°F | Resp 16 | Ht 70.0 in | Wt 200.6 lb

## 2019-11-19 DIAGNOSIS — I10 Essential (primary) hypertension: Secondary | ICD-10-CM | POA: Diagnosis not present

## 2019-11-19 DIAGNOSIS — R7303 Prediabetes: Secondary | ICD-10-CM

## 2019-11-19 DIAGNOSIS — E663 Overweight: Secondary | ICD-10-CM

## 2019-11-19 DIAGNOSIS — Z0000 Encounter for general adult medical examination without abnormal findings: Secondary | ICD-10-CM

## 2019-11-19 DIAGNOSIS — K219 Gastro-esophageal reflux disease without esophagitis: Secondary | ICD-10-CM

## 2019-11-19 DIAGNOSIS — E7800 Pure hypercholesterolemia, unspecified: Secondary | ICD-10-CM

## 2019-11-19 DIAGNOSIS — Z8546 Personal history of malignant neoplasm of prostate: Secondary | ICD-10-CM

## 2019-11-19 DIAGNOSIS — N529 Male erectile dysfunction, unspecified: Secondary | ICD-10-CM

## 2019-11-19 DIAGNOSIS — E559 Vitamin D deficiency, unspecified: Secondary | ICD-10-CM

## 2019-11-19 MED ORDER — OMEPRAZOLE 20 MG PO TBEC: 20.0000 mg | DELAYED_RELEASE_TABLET | Freq: Every day | ORAL | 3 refills | Status: DC

## 2019-11-19 MED ORDER — SILDENAFIL CITRATE 20 MG PO TABS: ORAL_TABLET | ORAL | 5 refills | Status: DC

## 2019-11-19 NOTE — Assessment & Plan Note (Addendum)
ED as complication S/p prostate cancer treatment Reorder Sildenafil 3-4 per dose, [facility], [facility] + refills

## 2019-11-19 NOTE — Progress Notes (Signed)
Subjective:    Patient ID: Justin Mckee, male    DOB: Apr 07, 1956, 64 y.o.   MRN: [id number]  Justin Mckee is a 64 y.o. male presenting on 11/19/2019 for Hypertension   HPI  Recurrent Headaches R sided - resolved Chronic Hypertension - Last visit with me 09/2019, for same problem, treated with new anti HTN med Amlodipine due to elevated BP thought causing headaches, trial on gabapentin without success, referred to [facility] Neurology, see prior notes for background information. - Update from Neuro 09/2019, he was started on Nortriptyline and it resolved the headache, has self tapered off med and also off his Amlodipine no longer needed since headache had resolved. - He has had normal BP readings now at home/work. No longer on any medicine. Resolved headache - He has returned to work since, note from Neurology. Off Amlodipine 10mg  for past 2-3 weeks now Home readings BP 120-80s Denies CP, dyspnea, edema, dizziness / lightheadedness, numbness weakness, hearing loss  GERD Chronic problem. Out of med now, was controlling GERD. Request re order omeprazole  ED Request refill sildenafil  History of acute PE - RESLVED Completed anticoagulation course Xarelto x 3 months. No concerns or evidence of recurrence.  Depression screen [facility] 2/9 08/31/2019 08/10/2019 03/02/2019  Decreased Interest 0 0 0  Down, Depressed, Hopeless 0 0 0  PHQ - 2 Score 0 0 0  Altered sleeping - - -  Tired, decreased energy - - -  Change in appetite - - -  Feeling bad or failure about yourself  - - -  Trouble concentrating - - -  Moving slowly or fidgety/restless - - -  Suicidal thoughts - - -  PHQ-9 Score - - -  Difficult doing work/chores - - -    Social History   Tobacco Use  . Smoking status: Former Smoker    Packs/day: 1.00    Years: 40.00    Pack years: 40.00    Types: Cigarettes    Quit date: 06/01/2016    Years since quitting: 3.4  . Smokeless tobacco: Former Systems developer    Quit date: 08/28/2015  Vaping Use  .  Vaping Use: Never used  Substance Use Topics  . Alcohol use: Yes    Alcohol/week: 24.0 standard drinks    Types: 24 Cans of beer per week    Comment:    . Drug use: No    Review of Systems Per HPI unless specifically indicated above     Objective:    BP 121/70   Pulse 86   Temp (!) 97.5 F (36.4 C) (Temporal)   Resp 16   Ht 5\' 10"  (1.778 m)   Wt 200 lb 9.6 oz (91 kg)   SpO2 100%   BMI 28.78 kg/m   Wt Readings from Last 3 Encounters:  11/19/19 200 lb 9.6 oz (91 kg)  09/15/19 192 lb 6.4 oz (87.3 kg)  08/31/19 195 lb (88.5 kg)    Physical Exam Vitals and nursing note reviewed.  Constitutional:      General: He is not in acute distress.    Appearance: He is well-developed. He is not diaphoretic.     Comments: Well-appearing, comfortable, cooperative  HENT:     Head: Normocephalic and atraumatic.  Eyes:     General:        Right eye: No discharge.        Left eye: No discharge.     Conjunctiva/sclera: Conjunctivae normal.  Neck:     Thyroid: No thyromegaly.  Cardiovascular:     Rate and Rhythm: Normal rate and regular rhythm.     Heart sounds: Normal heart sounds. No murmur heard.   Pulmonary:     Effort: Pulmonary effort is normal. No respiratory distress.     Breath sounds: Normal breath sounds. No wheezing or rales.  Musculoskeletal:        General: Normal range of motion.     Cervical back: Normal range of motion and neck supple.  Lymphadenopathy:     Cervical: No cervical adenopathy.  Skin:    General: Skin is warm and dry.     Findings: No erythema or rash.  Neurological:     Mental Status: He is alert and oriented to person, place, and time.  Psychiatric:        Behavior: Behavior normal.     Comments: Well groomed, good eye contact, normal speech and thoughts       Results for orders placed or performed in visit on 08/31/19  POCT glycosylated hemoglobin (Hb A1C)  Result Value Ref Range   Hemoglobin A1C 6.6 (A) 4.0 - 5.6 %      Assessment &  Plan:   Problem List Items Addressed This Visit    Pre-diabetes    Last A1c was elevated at 6.6 secondary to steroid therapy for covid Not due for repeat A1c Will repeat with upcoming lab  Plan:  1. Not on any therapy currently 2. Encourage improved lifestyle - low carb, low sugar diet, reduce portion size, continue improving regular exercise      GERD (gastroesophageal reflux disease)    Stable Re ordered PPI Omeprazole 20mg       Relevant Medications   Omeprazole 20 MG TBEC   Essential hypertension - Primary    Seems controlled BP now on lifestyle. Resolved HA. Question if HA was causing elevated BP OFF medication Amlodipine 10mg  Home readings improved.  No known complications     Plan:  1. Off Amlodipine 10mg , can remain off for now - seems no longer having issues with elevated BP 2. Encourage improved lifestyle - low sodium diet, regular exercise 3. Continue monitor BP outside office, bring readings to next visit, if persistently >140/90 or new symptoms notify office sooner      Relevant Medications   sildenafil (REVATIO) 20 MG tablet   ED (erectile dysfunction)    ED as complication S/p prostate cancer treatment Reorder Sildenafil 3-4 per dose[facility] refills      Relevant Medications   sildenafil (REVATIO) 20 MG tablet      Meds ordered this encounter  Medications  . Omeprazole 20 MG TBEC    Sig: Take 1 tablet (20 mg total) by mouth daily before breakfast.    Dispense:  90 tablet    Refill:  3  . sildenafil (REVATIO) 20 MG tablet    Sig: Take 3-4 pills about 30 min prior to sex.    Dispense:  30 tablet    Refill:  5      Follow up plan: Return in about 3 months (around 02/19/2020) for Annual Physical.  Future labs ordered for 02/12/20   Nobie Putnam, [facility] 11/19/2019, 11:13 AM

## 2019-11-19 NOTE — Assessment & Plan Note (Addendum)
Last A1c was elevated at 6.6 secondary to steroid therapy for covid Not due for repeat A1c Will repeat with upcoming lab  Plan:  1. Not on any therapy currently 2. Encourage improved lifestyle - low carb, low sugar diet, reduce portion size, continue improving regular exercise

## 2019-11-19 NOTE — Assessment & Plan Note (Signed)
Stable Re ordered PPI Omeprazole 20mg 

## 2019-11-19 NOTE — Assessment & Plan Note (Addendum)
Seems controlled BP now on lifestyle. Resolved HA. Question if HA was causing elevated BP OFF medication Amlodipine 10mg  Home readings improved.  No known complications     Plan:  1. Off Amlodipine 10mg , can remain off for now - seems no longer having issues with elevated BP 2. Encourage improved lifestyle - low sodium diet, regular exercise 3. Continue monitor BP outside office, bring readings to next visit, if persistently >140/90 or new symptoms notify office sooner

## 2019-11-19 NOTE — Patient Instructions (Addendum)
Thank you for coming to the office today.  Keep up the great work.  BP is controlled, off of amlodipine  [facility]  If headaches are gone, you are all set. If needed we can talk or you can call Neurologist.  DUE for FASTING BLOOD WORK (no food or drink after midnight before the lab appointment, only water or coffee without cream/sugar on the morning of)  SCHEDULE "Lab Only" visit in the morning at the clinic for lab draw in 3 MONTHS   - Make sure Lab Only appointment is at about 1 week before your next appointment, so that results will be available  For Lab Results, once available within 2-3 days of blood draw, you can can log in to MyChart online to view your results and a brief explanation. Also, we can discuss results at next follow-up visit.   Please schedule a Follow-up Appointment to: Return in about 3 months (around 02/19/2020) for Annual Physical.  If you have any other questions or concerns, please feel free to call the office or send a message through [place]. You may also schedule an earlier appointment if necessary.  Additionally, you may be receiving a survey about your experience at our office within a few days to 1 week by e-mail or mail. We value your feedback.  Nobie Putnam, DO [place]

## 2020-02-11 ENCOUNTER — Other Ambulatory Visit: Payer: Self-pay | Admitting: *Deleted

## 2020-02-11 DIAGNOSIS — E559 Vitamin D deficiency, unspecified: Secondary | ICD-10-CM

## 2020-02-11 DIAGNOSIS — Z0000 Encounter for general adult medical examination without abnormal findings: Secondary | ICD-10-CM

## 2020-02-11 DIAGNOSIS — I10 Essential (primary) hypertension: Secondary | ICD-10-CM

## 2020-02-11 DIAGNOSIS — R7303 Prediabetes: Secondary | ICD-10-CM

## 2020-02-11 DIAGNOSIS — E7800 Pure hypercholesterolemia, unspecified: Secondary | ICD-10-CM

## 2020-02-11 DIAGNOSIS — Z8546 Personal history of malignant neoplasm of prostate: Secondary | ICD-10-CM

## 2020-02-11 DIAGNOSIS — E663 Overweight: Secondary | ICD-10-CM

## 2020-02-12 ENCOUNTER — Other Ambulatory Visit: Payer: BC Managed Care – PPO

## 2020-02-12 ENCOUNTER — Other Ambulatory Visit: Payer: Self-pay

## 2020-02-12 DIAGNOSIS — E559 Vitamin D deficiency, unspecified: Secondary | ICD-10-CM | POA: Diagnosis not present

## 2020-02-12 DIAGNOSIS — E7800 Pure hypercholesterolemia, unspecified: Secondary | ICD-10-CM | POA: Diagnosis not present

## 2020-02-12 DIAGNOSIS — Z8546 Personal history of malignant neoplasm of prostate: Secondary | ICD-10-CM | POA: Diagnosis not present

## 2020-02-12 DIAGNOSIS — I10 Essential (primary) hypertension: Secondary | ICD-10-CM | POA: Diagnosis not present

## 2020-02-12 DIAGNOSIS — R7303 Prediabetes: Secondary | ICD-10-CM | POA: Diagnosis not present

## 2020-02-13 LAB — COMPLETE METABOLIC PANEL WITH GFR
AG Ratio: 1.4 (calc) (ref 1.0–2.5)
ALT: 20 U/L (ref 9–46)
AST: 14 U/L (ref 10–35)
Albumin: 4.2 g/dL (ref 3.6–5.1)
Alkaline phosphatase (APISO): 38 U/L (ref 35–144)
BUN: 10 mg/dL (ref 7–25)
CO2: 22 mmol/L (ref 20–32)
Calcium: 9.1 mg/dL (ref 8.6–10.3)
Chloride: 107 mmol/L (ref 98–110)
Creat: 1.01 mg/dL (ref 0.70–1.25)
GFR, Est African American: 91 mL/min/{1.73_m2} (ref >=60)
GFR, Est Non African American: 78 mL/min/{1.73_m2} (ref >=60)
Globulin: 3 g/dL (calc) (ref 1.9–3.7)
Glucose, Bld: 118 mg/dL — ABNORMAL HIGH (ref 65–99)
Potassium: 4.2 mmol/L (ref 3.5–5.3)
Sodium: 138 mmol/L (ref 135–146)
Total Bilirubin: 0.6 mg/dL (ref 0.2–1.2)
Total Protein: 7.2 g/dL (ref 6.1–8.1)

## 2020-02-13 LAB — LIPID PANEL
Cholesterol: 190 mg/dL (ref <200)
HDL: 47 mg/dL (ref >=40)
LDL Cholesterol (Calc): 126 mg/dL (calc) — ABNORMAL HIGH
Non-HDL Cholesterol (Calc): 143 mg/dL (calc) — ABNORMAL HIGH (ref <130)
Total CHOL/HDL Ratio: 4 (calc) (ref <5.0)
Triglycerides: 75 mg/dL (ref <150)

## 2020-02-13 LAB — CBC WITH DIFFERENTIAL/PLATELET
Absolute Monocytes: 586 cells/uL (ref 200–950)
Basophils Absolute: 61 cells/uL (ref 0–200)
Basophils Relative: 1 %
Eosinophils Absolute: 92 cells/uL (ref 15–500)
Eosinophils Relative: 1.5 %
HCT: 40.1 % (ref 38.5–50.0)
Hemoglobin: 13.3 g/dL (ref 13.2–17.1)
Lymphs Abs: 2971 cells/uL (ref 850–3900)
MCH: 30.6 pg (ref 27.0–33.0)
MCHC: 33.2 g/dL (ref 32.0–36.0)
MCV: 92.4 fL (ref 80.0–100.0)
MPV: 10.2 fL (ref 7.5–12.5)
Monocytes Relative: 9.6 %
Neutro Abs: 2391 cells/uL (ref 1500–7800)
Neutrophils Relative %: 39.2 %
Platelets: 337 10*3/uL (ref 140–400)
RBC: 4.34 10*6/uL (ref 4.20–5.80)
RDW: 14.5 % (ref 11.0–15.0)
Total Lymphocyte: 48.7 %
WBC: 6.1 10*3/uL (ref 3.8–10.8)

## 2020-02-13 LAB — TSH: TSH: 1.76 mIU/L (ref 0.40–4.50)

## 2020-02-13 LAB — VITAMIN D 25 HYDROXY (VIT D DEFICIENCY, FRACTURES): Vit D, 25-Hydroxy: 12 ng/mL — ABNORMAL LOW (ref 30–100)

## 2020-02-13 LAB — HEMOGLOBIN A1C
Hgb A1c MFr Bld: 6.3 % of total Hgb — ABNORMAL HIGH (ref <5.7)
Mean Plasma Glucose: 134 (calc)
eAG (mmol/L): 7.4 (calc)

## 2020-02-13 LAB — PSA: PSA: 0.2 ng/mL (ref <=4.0)

## 2020-02-19 ENCOUNTER — Ambulatory Visit (INDEPENDENT_AMBULATORY_CARE_PROVIDER_SITE_OTHER): Payer: BC Managed Care – PPO | Admitting: Family Medicine

## 2020-02-19 ENCOUNTER — Encounter: Payer: Self-pay | Admitting: Family Medicine

## 2020-02-19 ENCOUNTER — Other Ambulatory Visit: Payer: Self-pay

## 2020-02-19 VITALS — BP 148/84 | HR 65 | Temp 97.1°F | Resp 16 | Ht 70.0 in | Wt 194.0 lb

## 2020-02-19 DIAGNOSIS — Z23 Encounter for immunization: Secondary | ICD-10-CM | POA: Diagnosis not present

## 2020-02-19 DIAGNOSIS — Z0000 Encounter for general adult medical examination without abnormal findings: Secondary | ICD-10-CM | POA: Diagnosis not present

## 2020-02-19 DIAGNOSIS — E7800 Pure hypercholesterolemia, unspecified: Secondary | ICD-10-CM

## 2020-02-19 DIAGNOSIS — R7303 Prediabetes: Secondary | ICD-10-CM

## 2020-02-19 DIAGNOSIS — Z8546 Personal history of malignant neoplasm of prostate: Secondary | ICD-10-CM

## 2020-02-19 DIAGNOSIS — K219 Gastro-esophageal reflux disease without esophagitis: Secondary | ICD-10-CM

## 2020-02-19 DIAGNOSIS — E663 Overweight: Secondary | ICD-10-CM

## 2020-02-19 DIAGNOSIS — E559 Vitamin D deficiency, unspecified: Secondary | ICD-10-CM

## 2020-02-19 DIAGNOSIS — I10 Essential (primary) hypertension: Secondary | ICD-10-CM

## 2020-02-19 MED ORDER — ROSUVASTATIN CALCIUM 10 MG PO TABS: 10.0000 mg | ORAL_TABLET | Freq: Every day | ORAL | 3 refills | Status: DC

## 2020-02-19 MED ORDER — VITAMIN D 50 MCG (2000 UT) PO CAPS: 2000.0000 [IU] | ORAL_CAPSULE | Freq: Every day | ORAL | Status: DC

## 2020-02-19 NOTE — Assessment & Plan Note (Signed)
Elevated BP reading now. Improve on re-check Lack of home BP readings Remains off BP Med Resolved HA. Question if HA was causing elevated BP OFF medication Amlodipine 10mg   No known complications     Plan:  1. For now can still remain OFF Amlodipine 10mg  - he should check BP regularly for next few weeks. 2. Encourage improved lifestyle - low sodium diet, regular exercise 3. Continue monitor BP outside office, bring readings to next visit, if persistently >140/90 or new symptoms notify office sooner  Return in 3 months for labs / follow-up, may resume BP med at that time

## 2020-02-19 NOTE — Patient Instructions (Addendum)
Thank you for coming to the office today.  Keep close watch on blood pressure. If >150/90 consistently then please seek care sooner.  1. Chemistry - Normal results, including electrolytes, kidney and liver function. - Slightly elevated fasting blood sugar   2. Hemoglobin A1c (Diabetes screening) - 6.3, normal not in range of Pre-Diabetes (>5.7 to 6.4) improved from last result 6.6  3. PSA Prostate Cancer Screening - 0.2, negative.  4. TSH Thyroid Function Tests - Normal.  5. CBC Blood Counts - Normal, no anemia, no other significant abnormality  6. Cholesterol - Elevated LDL 126, otherwise normal. The 10-year ASCVD risk score Mikey Bussing DC Jr., et al., 2013) is: 14.1%  7. Vitamin D - 12, low compared to last result 16, 3 yr ago Start Vitamin D3 2,000 unit daily - OTC  You are at increased risk of future Cardiovascular complications such as Heart Attack or Stroke from an artery blockage due to abnormal cholesterol and/or risk factors. - As discussed, Statin Cholesterol pills both can both LOWER cholesterol and REDUCE this future risk of heart attack and stroke - Start Rosuvastatin (generic Crestor) 10mg  pill once at bedtime every night  If you develop mild aches or pains in muscle or joint that does NOT improve or go away after first 3-4 weeks then this may require [place] to adjust the dose. First I would recommend STOPPING the medication for a few weeks until your ache and pain symptoms completely RESOLVE. Then you can restart at a LOWER DOSE either HALF a pill at bedtime every night or LESS OFTEN such as one pill a week only and then gradually increase to every other day or max dose of 3 times a week  Lastly, sometimes we need to try other versions of this medicine to find one that works for you and does not cause side effects.    DUE for FASTING BLOOD WORK (no food or drink after midnight before the lab appointment, only water or coffee without cream/sugar on the morning  of)  SCHEDULE "Lab Only" visit in the morning at the clinic for lab draw in 3 MONTHS   - Make sure Lab Only appointment is at about 1 week before your next appointment, so that results will be available  For Lab Results, once available within 2-3 days of blood draw, you can can log in to MyChart online to view your results and a brief explanation. Also, we can discuss results at next follow-up visit.   Please schedule a Follow-up Appointment to: No follow-ups on file.  If you have any other questions or concerns, please feel free to call the office or send a message through [place]. You may also schedule an earlier appointment if necessary.  Additionally, you may be receiving a survey about your experience at our office within a few days to 1 week by e-mail or mail. We value your feedback.  Nobie Putnam, DO [place]

## 2020-02-19 NOTE — Assessment & Plan Note (Signed)
Uncontrolled cholesterol 126 Last lipid panel 02/2020 The 10-year ASCVD risk score Justin Bussing DC Jr., et al., 2013) is: 12.3%  Plan: 1. Discuss ASCVD risk. START Rosuvastatin 10mg  nightly for LDL reduction and ASCVD risk reduction primary prevention. 2. Encourage improved lifestyle - low carb/cholesterol, reduce portion size, continue improving regular exercise  F/u 3 months, lipid CMET

## 2020-02-19 NOTE — Assessment & Plan Note (Signed)
Stable Controlled on PPI

## 2020-02-19 NOTE — Assessment & Plan Note (Signed)
Improved A1c to 6.3  Plan:  1. Not on any therapy currently 2. Encourage improved lifestyle - low carb, low sugar diet, reduce portion size, continue improving regular exercise 

## 2020-02-19 NOTE — Assessment & Plan Note (Signed)
Stable without evidence of recurrence Last PSA 0.2 (9.2021) S/p Prostatectomy 2012 [facility] Has not returned to follow-up with Urology, if abnormal PSA or other concern for recurrence will refer back to [facility]

## 2020-02-19 NOTE — Assessment & Plan Note (Signed)
Weight down 6 lbs Encouraged diet exercise lifestyle
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
## 2020-02-19 NOTE — Assessment & Plan Note (Signed)
Continue OTC Vitamin D

## 2020-05-03 DIAGNOSIS — H40013 Open angle with borderline findings, low risk, bilateral: Secondary | ICD-10-CM | POA: Diagnosis not present

## 2020-05-25 ENCOUNTER — Encounter: Payer: Self-pay | Admitting: Family Medicine

## 2020-05-25 ENCOUNTER — Other Ambulatory Visit: Payer: Self-pay | Admitting: Family Medicine

## 2020-05-25 ENCOUNTER — Ambulatory Visit (INDEPENDENT_AMBULATORY_CARE_PROVIDER_SITE_OTHER): Payer: BC Managed Care – PPO | Admitting: Family Medicine

## 2020-05-25 ENCOUNTER — Other Ambulatory Visit: Payer: Self-pay

## 2020-05-25 VITALS — BP 138/88 | HR 80 | Temp 97.6°F | Resp 16 | Ht 70.0 in | Wt 200.6 lb

## 2020-05-25 DIAGNOSIS — E7800 Pure hypercholesterolemia, unspecified: Secondary | ICD-10-CM

## 2020-05-25 DIAGNOSIS — K219 Gastro-esophageal reflux disease without esophagitis: Secondary | ICD-10-CM

## 2020-05-25 DIAGNOSIS — I10 Essential (primary) hypertension: Secondary | ICD-10-CM | POA: Diagnosis not present

## 2020-05-25 DIAGNOSIS — R7303 Prediabetes: Secondary | ICD-10-CM

## 2020-05-25 MED ORDER — ROSUVASTATIN CALCIUM 10 MG PO TABS: 10.0000 mg | ORAL_TABLET | ORAL | 3 refills | Status: DC

## 2020-05-25 MED ORDER — OMEPRAZOLE 20 MG PO TBEC: 20.0000 mg | DELAYED_RELEASE_TABLET | Freq: Every day | ORAL | 3 refills | Status: DC

## 2020-05-25 MED ORDER — PANTOPRAZOLE SODIUM 20 MG PO TBEC: 20.0000 mg | DELAYED_RELEASE_TABLET | Freq: Every day | ORAL | 3 refills | Status: DC

## 2020-05-25 MED ORDER — FAMOTIDINE 20 MG PO TABS: 20.0000 mg | ORAL_TABLET | Freq: Two times a day (BID) | ORAL | 2 refills | Status: DC

## 2020-05-25 NOTE — Assessment & Plan Note (Signed)
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 REDUCE frequency statin - Rosuvastatin 10mg  EVERY OTHER DAY now, new instructions ordered Check fasting lipid today and LFTs 2. Encourage improved lifestyle - low carb/cholesterol, reduce portion size, continue improving regular exercise

## 2020-05-25 NOTE — Telephone Encounter (Signed)
Pharmacy is requesting alternative medication- not covered by insurance- sent for review

## 2020-05-25 NOTE — Assessment & Plan Note (Signed)
Previously Improved A1c to 6.3 Due today for A1c repeat  Plan:  1. Not on any therapy currently 2. Encourage improved lifestyle - low carb, low sugar diet, reduce portion size, continue improving regular exercise

## 2020-05-25 NOTE — Assessment & Plan Note (Signed)
Controlled Re order PPI Omeprazole

## 2020-05-25 NOTE — Patient Instructions (Addendum)
Thank you for coming to the office today.  Keep track of BP, follow up next time, bring copy of readings.  Labs today to check if cholesterol med is working.  Take the Rosuvastatin cholesterol med ONE EVERY OTHER DAY for now.  Please schedule a Follow-up Appointment to: Return in about 4 months (around 09/23/2020) for 4 month follow-up PreDM A1c, HTN, HLD.  If you have any other questions or concerns, please feel free to call the office or send a message through [place]. You may also schedule an earlier appointment if necessary.  Additionally, you may be receiving a survey about your experience at our office within a few days to 1 week by e-mail or mail. We value your feedback.  Nobie Putnam, DO [place]

## 2020-05-25 NOTE — Progress Notes (Signed)
Subjective:    Patient ID: Justin Mckee, male    DOB: 01/13/1956, 64 y.o.   MRN: [id number]  Justin Mckee is a 64 y.o. male presenting on 05/25/2020 for Hyperlipidemia and Hypertension   HPI   Pre-Diabetes/OverweightBMI >28 Previous A1c 6.3, due today Meds: never on meds Lifestyle Weight up - Diet (Still doing well, no significant change, drinks plenty of water, some reduced sodas now few times week max, not daily, reduced starches and carbs) - Exercise (increased exercise) Denies hypoglycemia, polyuria  ChronicHypertension Previously earlier 2021, he was started on Amlodipine for BP and Nortriptyline for headaches, saw Neuro, eventually came off meds, these problems resolved. Improved BP readings recently Remains off Amlodipine Denies Headaches(still resolved) CP, dyspnea, edema, dizziness / lightheadedness, numbness weakness, hearing loss  HYPERLIPIDEMIA: - Reports no concerns. Last lipid panel elevated LDL. 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 Omeprazole 20 daily. Good results, needs refill    Depression screen [facility] 2/9 05/25/2020 02/19/2020 08/31/2019  Decreased Interest 0 0 0  Down, Depressed, Hopeless 0 0 0  PHQ - 2 Score 0 0 0  Altered sleeping - - -  Tired, decreased energy - - -  Change in appetite - - -  Feeling bad or failure about yourself  - - -  Trouble concentrating - - -  Moving slowly or fidgety/restless - - -  Suicidal thoughts - - -  PHQ-9 Score - - -  Difficult doing work/chores - - -    Social History   Tobacco Use  . Smoking status: Former Smoker    Packs/day: 1.00    Years: 40.00    Pack years: 40.00    Types: Cigarettes    Quit date: 06/01/2016    Years since quitting: 3.9  . Smokeless tobacco: Former Systems developer    Quit date: 08/28/2015  Vaping Use  . Vaping Use: Never used  Substance Use Topics  .  Alcohol use: Yes    Alcohol/week: 24.0 standard drinks    Types: 24 Cans of beer per week    Comment:    . Drug use: No    Review of Systems Per HPI unless specifically indicated above     Objective:    BP 138/88 (BP Location: Left Arm, Cuff Size: Normal)   Pulse 80   Temp 97.6 F (36.4 C) (Temporal)   Resp 16   Ht 5\' 10"  (1.778 m)   Wt 200 lb 9.6 oz (91 kg)   SpO2 100%   BMI 28.78 kg/m   Wt Readings from Last 3 Encounters:  05/25/20 200 lb 9.6 oz (91 kg)  02/19/20 194 lb (88 kg)  11/19/19 200 lb 9.6 oz (91 kg)    Physical Exam Vitals and nursing note reviewed.  Constitutional:      General: He is not in acute distress.    Appearance: He is well-developed and well-nourished. He is not diaphoretic.     Comments: Well-appearing, comfortable, cooperative  HENT:     Head: Normocephalic and atraumatic.     Mouth/Throat:     Mouth: Oropharynx is clear and moist.  Eyes:     General:        Right eye: No discharge.        Left eye: No discharge.     Conjunctiva/sclera: Conjunctivae normal.  Neck:     Thyroid: No thyromegaly.  Cardiovascular:     Rate and Rhythm: Normal rate and regular rhythm.     Pulses: Intact distal pulses.     Heart sounds: Normal heart sounds. No murmur heard.   Pulmonary:     Effort: Pulmonary effort is normal. No respiratory distress.     Breath sounds: Normal breath sounds. No wheezing or rales.  Musculoskeletal:        General: No edema. Normal range of motion.     Cervical back: Normal range of motion and neck supple.  Lymphadenopathy:     Cervical: No cervical adenopathy.  Skin:    General: Skin is warm and dry.     Findings: No erythema or rash.  Neurological:     Mental Status: He is alert and oriented to person, place, and time.  Psychiatric:        Mood and Affect: Mood and affect normal.        Behavior: Behavior normal.     Comments: Well groomed, good eye contact, normal speech and thoughts       Results for orders  placed or performed in visit on 02/11/20  VITAMIN D 25 Hydroxy (Vit-D Deficiency, Fractures)  Result Value Ref Range   Vit D, 25-Hydroxy 12 (L) 30 - 100 ng/mL  TSH  Result Value Ref Range   TSH 1.76 0.40 - 4.50 mIU/L  PSA  Result Value Ref Range   PSA 0.2 < OR = 4.0 ng/mL  Lipid panel  Result Value Ref Range   Cholesterol 190 <200 mg/dL   HDL 47 > OR = 40 mg/dL   Triglycerides 75 <150 mg/dL   LDL Cholesterol (Calc) 126 (H) mg/dL (calc)   Total CHOL/HDL Ratio 4.0 <5.0 (calc)   Non-HDL Cholesterol (Calc) 143 (H) <130 mg/dL (calc)  COMPLETE METABOLIC PANEL WITH GFR  Result Value Ref Range   Glucose, Bld 118 (H) 65 - 99 mg/dL   BUN 10 7 - 25 mg/dL   Creat 1.01 0.70 - 1.25 mg/dL   GFR, Est Non African American 78 > OR = 60 mL/min/1.8m2   GFR, Est African American 91 > OR = 60 mL/min/1.58m2   BUN/Creatinine Ratio NOT APPLICABLE 6 - 22 (calc)   Sodium 138 135 - 146 mmol/L   Potassium 4.2 3.5 - 5.3 mmol/L   Chloride 107 98 - 110 mmol/L   CO2 22 20 - 32 mmol/L   Calcium 9.1 8.6 - 10.3 mg/dL   Total Protein 7.2 6.1 - 8.1 g/dL   Albumin 4.2 3.6 - 5.1 g/dL   Globulin 3.0 1.9 - 3.7 g/dL (calc)   AG Ratio 1.4 1.0 - 2.5 (calc)   Total Bilirubin 0.6 0.2 - 1.2 mg/dL   Alkaline phosphatase (APISO) 38 35 - 144 U/L   AST 14 10 - 35 U/L   ALT 20 9 - 46 U/L  CBC with Differential/Platelet  Result Value Ref Range   WBC 6.1 3.8 - 10.8 Thousand/uL   RBC 4.34 4.20 - 5.80 Million/uL   Hemoglobin 13.3 13.2 - 17.1 g/dL   HCT 40.1 38.5 - 50.0 %   MCV 92.4 80.0 - 100.0 fL   MCH 30.6 27.0 - 33.0 pg   MCHC 33.2 32.0 - 36.0 g/dL   RDW 14.5 11.0 - 15.0 %   Platelets 337 140 - 400 Thousand/uL   MPV 10.2 7.5 - 12.5 fL   Neutro Abs 2,391 1,500 - 7,800 cells/uL   Lymphs Abs 2,971 850 - 3,900 cells/uL   Absolute Monocytes 586  200 - 950 cells/uL   Eosinophils Absolute 92 15 - 500 cells/uL   Basophils Absolute 61 0 - 200 cells/uL   Neutrophils Relative % 39.2 %   Total Lymphocyte 48.7 %   Monocytes  Relative 9.6 %   Eosinophils Relative 1.5 %   Basophils Relative 1.0 %  Hemoglobin A1c  Result Value Ref Range   Hgb A1c MFr Bld 6.3 (H) <5.7 % of total Hgb   Mean Plasma Glucose 134 (calc)   eAG (mmol/L) 7.4 (calc)      Assessment & Plan:   Problem List Items Addressed This Visit    Pure hypercholesterolemia - Primary    Previously uncontrolled cholesterol 126 = now due for Lipid panel now after 3 months on initial statin therapy 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 REDUCE frequency statin - Rosuvastatin 10mg  EVERY OTHER DAY now, new instructions ordered Check fasting lipid today and LFTs 2. Encourage improved lifestyle - low carb/cholesterol, reduce portion size, continue improving regular exercise      Relevant Medications   rosuvastatin (CRESTOR) 10 MG tablet   Other Relevant Orders   Lipid panel   COMPLETE METABOLIC PANEL WITH GFR   Pre-diabetes    Previously Improved A1c to 6.3 Due today for A1c repeat  Plan:  1. Not on any therapy currently 2. Encourage improved lifestyle - low carb, low sugar diet, reduce portion size, continue improving regular exercise      Relevant Orders   Hemoglobin A1c   GERD (gastroesophageal reflux disease)    Controlled Re order PPI Omeprazole      Relevant Medications   Omeprazole 20 MG TBEC   Essential hypertension    Elevated BP reading now. Improve on re-check, near goal Home BP improved Remains off BP Med Resolved HA. Question if HA was causing elevated BP OFF medication Amlodipine 10mg   No known complications     Plan:  1. For now can still remain OFF Amlodipine 10mg  - he should check BP still - discuss may restart lower dose in future 2. Encourage improved lifestyle - low sodium diet, regular exercise 3. Continue monitor BP outside office, bring readings to next visit, if persistently >140/90 or new symptoms notify  office sooner      Relevant Medications   rosuvastatin (CRESTOR) 10 MG tablet      Meds ordered this encounter  Medications  . Omeprazole 20 MG TBEC    Sig: Take 1 tablet (20 mg total) by mouth daily before breakfast.    Dispense:  90 tablet    Refill:  3  . rosuvastatin (CRESTOR) 10 MG tablet    Sig: Take 1 tablet (10 mg total) by mouth every other day.    Dispense:  45 tablet    Refill:  3    Patient should not need to fill, unless he is low, otherwise keep new instructions on file      Follow up plan: Return in about 4 months (around 09/23/2020) for 4 month follow-up PreDM A1c, HTN, HLD.   Nobie Putnam, [facility] 05/25/2020, 8:32 AM

## 2020-05-25 NOTE — Assessment & Plan Note (Signed)
Elevated BP reading now. Improve on re-check, near goal Home BP improved Remains off BP Med Resolved HA. Question if HA was causing elevated BP OFF medication Amlodipine 10mg   No known complications     Plan:  1. For now can still remain OFF Amlodipine 10mg  - he should check BP still - discuss may restart lower dose in future 2. Encourage improved lifestyle - low sodium diet, regular exercise 3. Continue monitor BP outside office, bring readings to next visit, if persistently >140/90 or new symptoms notify office sooner

## 2020-05-25 NOTE — Telephone Encounter (Signed)
Requested medication (s) are due for refill today: No  Requested medication (s) are on the active medication list: Yes  Last refill:  05/25/20  Future visit scheduled: Yes  Notes to clinic:  Pharmacy states medication not covered. States they do not have a list of covered medications under pt.'s insurance.    Requested Prescriptions  Pending Prescriptions Disp Refills   pantoprazole (PROTONIX) 20 MG tablet [Pharmacy Med Name: PANTOPRAZOLE SOD DR 20 MG TAB] 90 tablet 3    Sig: TAKE 1 TABLET BY MOUTH DAILY BEFORE BREAKFAST.      Gastroenterology: Proton Pump Inhibitors Passed - 05/25/2020  3:36 PM      Passed - Valid encounter within last 12 months    Recent Outpatient Visits           Today Pure hypercholesterolemia   [place], Devonne Doughty, DO   3 months ago Annual physical exam   [facility] Olin Hauser, DO   6 months ago Essential hypertension   [place] Olin Hauser, DO   8 months ago Essential hypertension   [place], DO   8 months ago Pre-diabetes   [place], Devonne Doughty, [place]

## 2020-05-26 LAB — COMPLETE METABOLIC PANEL WITH GFR
AG Ratio: 1.5 (calc) (ref 1.0–2.5)
ALT: 22 U/L (ref 9–46)
AST: 21 U/L (ref 10–35)
Albumin: 4.4 g/dL (ref 3.6–5.1)
Alkaline phosphatase (APISO): 36 U/L (ref 35–144)
BUN: 13 mg/dL (ref 7–25)
CO2: 27 mmol/L (ref 20–32)
Calcium: 9.7 mg/dL (ref 8.6–10.3)
Chloride: 102 mmol/L (ref 98–110)
Creat: 1.09 mg/dL (ref 0.70–1.25)
GFR, Est African American: 83 mL/min/{1.73_m2} (ref >=60)
GFR, Est Non African American: 71 mL/min/{1.73_m2} (ref >=60)
Globulin: 3 g/dL (calc) (ref 1.9–3.7)
Glucose, Bld: 107 mg/dL — ABNORMAL HIGH (ref 65–99)
Potassium: 4.7 mmol/L (ref 3.5–5.3)
Sodium: 137 mmol/L (ref 135–146)
Total Bilirubin: 0.8 mg/dL (ref 0.2–1.2)
Total Protein: 7.4 g/dL (ref 6.1–8.1)

## 2020-05-26 LAB — LIPID PANEL
Cholesterol: 158 mg/dL (ref <200)
HDL: 43 mg/dL (ref >=40)
LDL Cholesterol (Calc): 100 mg/dL (calc) — ABNORMAL HIGH
Non-HDL Cholesterol (Calc): 115 mg/dL (calc) (ref <130)
Total CHOL/HDL Ratio: 3.7 (calc) (ref <5.0)
Triglycerides: 67 mg/dL (ref <150)

## 2020-05-26 LAB — HEMOGLOBIN A1C
Hgb A1c MFr Bld: 6.6 % of total Hgb — ABNORMAL HIGH (ref <5.7)
Mean Plasma Glucose: 143 mg/dL
eAG (mmol/L): 7.9 mmol/L

## 2020-07-20 ENCOUNTER — Telehealth: Payer: Self-pay | Admitting: *Deleted

## 2020-07-20 NOTE — Telephone Encounter (Signed)
Message left with patient to reminder of upcoming appts in the lung nodule clinic. Informed that follow up on 2/15 is being rescheduled to 2/16 at 1pm. Instructed to call back with any questions or if needs to reschedule.

## 2020-07-25 ENCOUNTER — Ambulatory Visit
Admission: RE | Admit: 2020-07-25 | Discharge: 2020-07-25 | Disposition: A | Discharge status: Home / Self Care | Payer: BC Managed Care – PPO | Source: Ambulatory Visit | Attending: Oncology | Admitting: Oncology

## 2020-07-25 ENCOUNTER — Other Ambulatory Visit: Payer: Self-pay

## 2020-07-25 DIAGNOSIS — J984 Other disorders of lung: Secondary | ICD-10-CM | POA: Diagnosis not present

## 2020-07-25 DIAGNOSIS — J439 Emphysema, unspecified: Secondary | ICD-10-CM | POA: Diagnosis not present

## 2020-07-25 DIAGNOSIS — I712 Thoracic aortic aneurysm, without rupture: Secondary | ICD-10-CM | POA: Diagnosis not present

## 2020-07-25 DIAGNOSIS — I700 Atherosclerosis of aorta: Secondary | ICD-10-CM | POA: Diagnosis not present

## 2020-07-25 DIAGNOSIS — R918 Other nonspecific abnormal finding of lung field: Secondary | ICD-10-CM | POA: Diagnosis not present

## 2020-07-26 ENCOUNTER — Ambulatory Visit: Payer: BC Managed Care – PPO | Admitting: Oncology

## 2020-07-27 ENCOUNTER — Other Ambulatory Visit: Payer: Self-pay

## 2020-07-27 ENCOUNTER — Inpatient Hospital Stay: Payer: BC Managed Care – PPO | Attending: Oncology | Admitting: Oncology

## 2020-07-27 ENCOUNTER — Encounter: Payer: Self-pay | Admitting: Family Medicine

## 2020-07-27 DIAGNOSIS — I712 Thoracic aortic aneurysm, without rupture: Secondary | ICD-10-CM | POA: Insufficient documentation

## 2020-07-27 DIAGNOSIS — Z87891 Personal history of nicotine dependence: Secondary | ICD-10-CM | POA: Diagnosis not present

## 2020-07-27 DIAGNOSIS — R918 Other nonspecific abnormal finding of lung field: Secondary | ICD-10-CM | POA: Diagnosis not present

## 2020-07-27 DIAGNOSIS — I7121 Aneurysm of the ascending aorta, without rupture: Secondary | ICD-10-CM | POA: Insufficient documentation

## 2020-07-27 DIAGNOSIS — Z8616 Personal history of COVID-19: Secondary | ICD-10-CM | POA: Diagnosis not present

## 2020-07-27 DIAGNOSIS — Z79899 Other long term (current) drug therapy: Secondary | ICD-10-CM | POA: Diagnosis not present

## 2020-07-27 DIAGNOSIS — Z8546 Personal history of malignant neoplasm of prostate: Secondary | ICD-10-CM | POA: Diagnosis not present

## 2020-07-27 DIAGNOSIS — Z86711 Personal history of pulmonary embolism: Secondary | ICD-10-CM | POA: Diagnosis not present
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
## 2020-09-27 ENCOUNTER — Ambulatory Visit (INDEPENDENT_AMBULATORY_CARE_PROVIDER_SITE_OTHER): Payer: BC Managed Care – PPO | Admitting: Family Medicine

## 2020-09-27 ENCOUNTER — Encounter: Payer: Self-pay | Admitting: Family Medicine

## 2020-09-27 ENCOUNTER — Other Ambulatory Visit: Payer: Self-pay

## 2020-09-27 ENCOUNTER — Other Ambulatory Visit: Payer: Self-pay | Admitting: Family Medicine

## 2020-09-27 VITALS — BP 150/90 | HR 72 | Temp 97.8°F | Ht 70.0 in | Wt 198.6 lb

## 2020-09-27 DIAGNOSIS — Z0000 Encounter for general adult medical examination without abnormal findings: Secondary | ICD-10-CM

## 2020-09-27 DIAGNOSIS — E7800 Pure hypercholesterolemia, unspecified: Secondary | ICD-10-CM

## 2020-09-27 DIAGNOSIS — R7303 Prediabetes: Secondary | ICD-10-CM

## 2020-09-27 DIAGNOSIS — E559 Vitamin D deficiency, unspecified: Secondary | ICD-10-CM

## 2020-09-27 DIAGNOSIS — I10 Essential (primary) hypertension: Secondary | ICD-10-CM

## 2020-09-27 DIAGNOSIS — Z8546 Personal history of malignant neoplasm of prostate: Secondary | ICD-10-CM

## 2020-09-27 LAB — POCT GLYCOSYLATED HEMOGLOBIN (HGB A1C): Hemoglobin A1C: 6.3 % — AB (ref 4.0–5.6)

## 2020-09-27 MED ORDER — AMLODIPINE BESYLATE 10 MG PO TABS: 10.0000 mg | ORAL_TABLET | Freq: Every day | ORAL | 3 refills | Status: DC

## 2020-09-27 NOTE — Progress Notes (Signed)
Subjective:    Patient ID: Justin Mckee, male    DOB: [birth year], 64 y.o.   MRN: [id number]  Justin Mckee is a 66 y.o. male presenting on 09/27/2020 for PreDM and Hypertension   HPI   ChronicHypertension Previously earlier 2021, he was started on Amlodipine for BP and Nortriptyline for headaches, saw Neuro, eventually came off meds, these problems resolved. Now update with elevated BP readings at home and here in office. Remains off Amlodipine Not on medication. DeniesHeadaches(still resolved)CP, dyspnea, edema, dizziness / lightheadedness, numbness weakness, hearing loss  HYPERLIPIDEMIA: - Reports no concerns. Last lipid panel 05/2020, improved on Statin however had symptoms was switched to intermittent - Currently taking Rosuvastatin 10mg  every OTHER day now, tolerating well with only mild myalgia ache   Pre-Diabetes Previous A1c 6.3 to 6.6, due today Meds: never on meds Lifestyle Weight up - Diet (Reduce sodas) - Exercise (increased exercise) Denies hypoglycemia, polyuria   Depression screen [facility] 2/9 05/25/2020 02/19/2020 08/31/2019  Decreased Interest 0 0 0  Down, Depressed, Hopeless 0 0 0  PHQ - 2 Score 0 0 0  Altered sleeping - - -  Tired, decreased energy - - -  Change in appetite - - -  Feeling bad or failure about yourself  - - -  Trouble concentrating - - -  Moving slowly or fidgety/restless - - -  Suicidal thoughts - - -  PHQ-9 Score - - -  Difficult doing work/chores - - -    Social History   Tobacco Use  . Smoking status: Former Smoker    Packs/day: 1.00    Years: 40.00    Pack years: 40.00    Types: Cigarettes    Quit date: 06/01/2016    Years since quitting: 4.3  . Smokeless tobacco: Former Systems developer    Quit date: 08/28/2015  Vaping Use  . Vaping Use: Never used  Substance Use Topics  . Alcohol use: Yes    Alcohol/week: 24.0 standard drinks    Types: 24 Cans of beer per week    Comment:    . Drug use: No    Review of Systems Per HPI unless  specifically indicated above     Objective:    BP (!) 150/90 (BP Location: Left Arm, Cuff Size: Normal)   Pulse 72   Temp 97.8 F (36.6 C) (Temporal)   Ht 5\' 10"  (1.778 m)   Wt 198 lb 9.6 oz (90.1 kg)   SpO2 96%   BMI 28.50 kg/m   Wt Readings from Last 3 Encounters:  09/27/20 198 lb 9.6 oz (90.1 kg)  05/25/20 200 lb 9.6 oz (91 kg)  02/19/20 194 lb (88 kg)    Physical Exam Vitals and nursing note reviewed.  Constitutional:      General: He is not in acute distress.    Appearance: He is well-developed. He is not diaphoretic.     Comments: Well-appearing, comfortable, cooperative  HENT:     Head: Normocephalic and atraumatic.  Eyes:     General:        Right eye: No discharge.        Left eye: No discharge.     Conjunctiva/sclera: Conjunctivae normal.  Neck:     Thyroid: No thyromegaly.  Cardiovascular:     Rate and Rhythm: Normal rate and regular rhythm.     Heart sounds: Normal heart sounds. No murmur heard.   Pulmonary:     Effort: Pulmonary effort is normal. No respiratory distress.  Breath sounds: Normal breath sounds. No wheezing or rales.  Musculoskeletal:        General: Normal range of motion.     Cervical back: Normal range of motion and neck supple.     Right lower leg: No edema.     Left lower leg: No edema.  Lymphadenopathy:     Cervical: No cervical adenopathy.  Skin:    General: Skin is warm and dry.     Findings: No erythema or rash.  Neurological:     Mental Status: He is alert and oriented to person, place, and time.  Psychiatric:        Behavior: Behavior normal.     Comments: Well groomed, good eye contact, normal speech and thoughts      Recent Labs    02/12/20 0826 05/25/20 0921 09/27/20 1623  HGBA1C 6.3* 6.6* 6.3*     Results for orders placed or performed in visit on 09/27/20  POCT glycosylated hemoglobin (Hb A1C)  Result Value Ref Range   Hemoglobin A1C 6.3 (A) 4.0 - 5.6 %      Assessment & Plan:   Problem List  Items Addressed This Visit    Pure hypercholesterolemia    Improved on Statin therapy Now on intermittent dosing still, tolerating well some mild myalgia Last lipid panel 12.2021 The 10-year ASCVD risk score Mikey Bussing DC Jr., et al., 2013) is: 20%  Plan: 1. Continue on Rosuvastatin 10mg  EVERY OTHER DAY 2. Encourage improved lifestyle - low carb/cholesterol, reduce portion size, continue improving regular exercise      Relevant Medications   amLODipine (NORVASC) 10 MG tablet   Pre-diabetes    Improved A1c to 6.3  Plan:  1. Not on any therapy currently 2. Encourage improved lifestyle - low carb, low sugar diet, reduce portion size, continue improving regular exercise      Relevant Orders   POCT glycosylated hemoglobin (Hb A1C) (Completed)   Essential hypertension - Primary    Elevated BP reading again. Improve on re-check, not at goal Home BP mild elevated Remains off BP Med  No known complications  Previously on Amlodipine   Plan:  1. RESTART Amlodipine 10mg  daily HTN therapy 2. Encourage improved lifestyle - low sodium diet, regular exercise 3. Continue monitor BP outside office, bring readings to next visit, if persistently >140/90 or new symptoms notify office sooner      Relevant Medications   amLODipine (NORVASC) 10 MG tablet        Meds ordered this encounter  Medications  . amLODipine (NORVASC) 10 MG tablet    Sig: Take 1 tablet (10 mg total) by mouth daily.    Dispense:  90 tablet    Refill:  3      Follow up plan: Return in about 5 months (around 02/27/2021) for 5 month fasting lab only then 1 week later Annual Physical.  Future labs ordered for 02/21/21   Nobie Putnam, [facility] 09/27/2020, 4:20 PM

## 2020-09-27 NOTE — Assessment & Plan Note (Addendum)
Improved A1c to 6.3  Plan:  1. Not on any therapy currently 2. Encourage improved lifestyle - low carb, low sugar diet, reduce portion size, continue improving regular exercise

## 2020-09-27 NOTE — Patient Instructions (Addendum)
Thank you for coming to the office today.  Check with insurance on the stomach acid medications to see which ones are preferred list / on the formulary and we can order that instead.  Keep OTC med for now.  List includes  - Omeprazole (Prilosec) - Pantoprazole (Protonix) - Esomeprazole (Nexium)  These come in 20mg  - same as OTC strength and also can be rx strength or higher dose for 40mg .  Sometimes insurance only pays for the 40mg  dosage.  Keep on cholesterol pill every other day.  BP still elevated - we will start Amlodipine 10mg  daily, restart this blood pressure pill for now.  Recent Labs    02/12/20 0826 05/25/20 0921 09/27/20 1623  HGBA1C 6.3* 6.6* 6.3*    Keep up good work on blood sugar.  DUE for FASTING BLOOD WORK (no food or drink after midnight before the lab appointment, only water or coffee without cream/sugar on the morning of)  SCHEDULE "Lab Only" visit in the morning at the clinic for lab draw in 5 MONTHS   - Make sure Lab Only appointment is at about 1 week before your next appointment, so that results will be available  For Lab Results, once available within 2-3 days of blood draw, you can can log in to MyChart online to view your results and a brief explanation. Also, we can discuss results at next follow-up visit.    Please schedule a Follow-up Appointment to: Return in about 5 months (around 02/27/2021) for 5 month fasting lab only then 1 week later Annual Physical.  If you have any other questions or concerns, please feel free to call the office or send a message through [place]. You may also schedule an earlier appointment if necessary.  Additionally, you may be receiving a survey about your experience at our office within a few days to 1 week by e-mail or mail. We value your feedback.  Nobie Putnam, DO [place]

## 2020-09-27 NOTE — Assessment & Plan Note (Addendum)
Improved on Statin therapy Now on intermittent dosing still, tolerating well some mild myalgia Last lipid panel 12.2021 The 10-year ASCVD risk score Justin Bussing DC Jr., et al., 2013) is: 20%  Plan: 1. Continue on Rosuvastatin 10mg  EVERY OTHER DAY 2. Encourage improved lifestyle - low carb/cholesterol, reduce portion size, continue improving regular exercise

## 2020-09-27 NOTE — Assessment & Plan Note (Signed)
Elevated BP reading again. Improve on re-check, not at goal Home BP mild elevated Remains off BP Med  No known complications  Previously on Amlodipine   Plan:  1. RESTART Amlodipine 10mg  daily HTN therapy 2. Encourage improved lifestyle - low sodium diet, regular exercise 3. Continue monitor BP outside office, bring readings to next visit, if persistently >140/90 or new symptoms notify office sooner

## 2021-02-20 ENCOUNTER — Other Ambulatory Visit: Payer: Self-pay

## 2021-02-20 DIAGNOSIS — E7800 Pure hypercholesterolemia, unspecified: Secondary | ICD-10-CM

## 2021-02-20 DIAGNOSIS — E559 Vitamin D deficiency, unspecified: Secondary | ICD-10-CM

## 2021-02-20 DIAGNOSIS — R7303 Prediabetes: Secondary | ICD-10-CM

## 2021-02-20 DIAGNOSIS — Z8546 Personal history of malignant neoplasm of prostate: Secondary | ICD-10-CM

## 2021-02-20 DIAGNOSIS — Z0000 Encounter for general adult medical examination without abnormal findings: Secondary | ICD-10-CM

## 2021-02-20 DIAGNOSIS — I10 Essential (primary) hypertension: Secondary | ICD-10-CM

## 2021-02-21 ENCOUNTER — Other Ambulatory Visit: Payer: Medicare Other

## 2021-02-21 ENCOUNTER — Other Ambulatory Visit: Payer: Self-pay

## 2021-02-21 DIAGNOSIS — E7800 Pure hypercholesterolemia, unspecified: Secondary | ICD-10-CM | POA: Diagnosis not present

## 2021-02-21 DIAGNOSIS — I10 Essential (primary) hypertension: Secondary | ICD-10-CM | POA: Diagnosis not present

## 2021-02-21 DIAGNOSIS — Z0000 Encounter for general adult medical examination without abnormal findings: Secondary | ICD-10-CM | POA: Diagnosis not present

## 2021-02-21 DIAGNOSIS — E559 Vitamin D deficiency, unspecified: Secondary | ICD-10-CM | POA: Diagnosis not present

## 2021-02-22 LAB — CBC WITH DIFFERENTIAL/PLATELET
Absolute Monocytes: 587 cells/uL (ref 200–950)
Basophils Absolute: 41 cells/uL (ref 0–200)
Basophils Relative: 0.8 %
Eosinophils Absolute: 82 cells/uL (ref 15–500)
Eosinophils Relative: 1.6 %
HCT: 40.2 % (ref 38.5–50.0)
Hemoglobin: 13.2 g/dL (ref 13.2–17.1)
Lymphs Abs: 1933 cells/uL (ref 850–3900)
MCH: 30.3 pg (ref 27.0–33.0)
MCHC: 32.8 g/dL (ref 32.0–36.0)
MCV: 92.4 fL (ref 80.0–100.0)
MPV: 10 fL (ref 7.5–12.5)
Monocytes Relative: 11.5 %
Neutro Abs: 2458 cells/uL (ref 1500–7800)
Neutrophils Relative %: 48.2 %
Platelets: 309 10*3/uL (ref 140–400)
RBC: 4.35 10*6/uL (ref 4.20–5.80)
RDW: 14.1 % (ref 11.0–15.0)
Total Lymphocyte: 37.9 %
WBC: 5.1 10*3/uL (ref 3.8–10.8)

## 2021-02-22 LAB — HEMOGLOBIN A1C
Hgb A1c MFr Bld: 6.1 % of total Hgb — ABNORMAL HIGH (ref <5.7)
Mean Plasma Glucose: 128 mg/dL
eAG (mmol/L): 7.1 mmol/L

## 2021-02-22 LAB — COMPLETE METABOLIC PANEL WITH GFR
AG Ratio: 1.7 (calc) (ref 1.0–2.5)
ALT: 20 U/L (ref 9–46)
AST: 22 U/L (ref 10–35)
Albumin: 4.5 g/dL (ref 3.6–5.1)
Alkaline phosphatase (APISO): 34 U/L — ABNORMAL LOW (ref 35–144)
BUN: 13 mg/dL (ref 7–25)
CO2: 27 mmol/L (ref 20–32)
Calcium: 9.5 mg/dL (ref 8.6–10.3)
Chloride: 103 mmol/L (ref 98–110)
Creat: 1.04 mg/dL (ref 0.70–1.35)
Globulin: 2.7 g/dL (calc) (ref 1.9–3.7)
Glucose, Bld: 117 mg/dL — ABNORMAL HIGH (ref 65–99)
Potassium: 4.2 mmol/L (ref 3.5–5.3)
Sodium: 137 mmol/L (ref 135–146)
Total Bilirubin: 0.7 mg/dL (ref 0.2–1.2)
Total Protein: 7.2 g/dL (ref 6.1–8.1)
eGFR: 80 mL/min/{1.73_m2} (ref >=60)

## 2021-02-22 LAB — PSA: PSA: 0.16 ng/mL (ref <=4.00)

## 2021-02-22 LAB — LIPID PANEL
Cholesterol: 171 mg/dL (ref <200)
HDL: 52 mg/dL (ref >=40)
LDL Cholesterol (Calc): 103 mg/dL (calc) — ABNORMAL HIGH
Non-HDL Cholesterol (Calc): 119 mg/dL (calc) (ref <130)
Total CHOL/HDL Ratio: 3.3 (calc) (ref <5.0)
Triglycerides: 71 mg/dL (ref <150)

## 2021-02-22 LAB — VITAMIN D 25 HYDROXY (VIT D DEFICIENCY, FRACTURES): Vit D, 25-Hydroxy: 21 ng/mL — ABNORMAL LOW (ref 30–100)

## 2021-02-28 ENCOUNTER — Other Ambulatory Visit: Payer: Self-pay

## 2021-02-28 ENCOUNTER — Encounter: Payer: Self-pay | Admitting: Family Medicine

## 2021-02-28 ENCOUNTER — Ambulatory Visit (INDEPENDENT_AMBULATORY_CARE_PROVIDER_SITE_OTHER): Payer: Medicare Other | Admitting: Family Medicine

## 2021-02-28 ENCOUNTER — Other Ambulatory Visit: Payer: Self-pay | Admitting: Family Medicine

## 2021-02-28 VITALS — BP 129/79 | HR 71 | Ht 70.0 in | Wt 192.0 lb

## 2021-02-28 DIAGNOSIS — Z1211 Encounter for screening for malignant neoplasm of colon: Secondary | ICD-10-CM

## 2021-02-28 DIAGNOSIS — Z0000 Encounter for general adult medical examination without abnormal findings: Secondary | ICD-10-CM | POA: Diagnosis not present

## 2021-02-28 DIAGNOSIS — D126 Benign neoplasm of colon, unspecified: Secondary | ICD-10-CM | POA: Diagnosis not present

## 2021-02-28 DIAGNOSIS — Z86010 Personal history of colonic polyps: Secondary | ICD-10-CM

## 2021-02-28 DIAGNOSIS — N529 Male erectile dysfunction, unspecified: Secondary | ICD-10-CM

## 2021-02-28 DIAGNOSIS — Z23 Encounter for immunization: Secondary | ICD-10-CM | POA: Diagnosis not present

## 2021-02-28 MED ORDER — SILDENAFIL CITRATE 20 MG PO TABS: ORAL_TABLET | ORAL | 5 refills | Status: DC

## 2021-02-28 MED ORDER — PEG 3350-KCL-NA BICARB-NACL 420 G PO SOLR: 4000.0000 mL | Freq: Once | ORAL | 0 refills | Status: AC
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
## 2021-02-28 NOTE — Progress Notes (Signed)
Gastroenterology Pre-Procedure Review  Request Date: 04/10/21 Requesting Physician: Dr. Allen Norris  PATIENT REVIEW QUESTIONS: The patient responded to the following health history questions as indicated:    1. Are you having any GI issues? no 2. Do you have a personal history of Polyps? yes (01/06/2016) 3. Do you have a family history of Colon Cancer or Polyps? no 4. Diabetes Mellitus?  Prediabetes 5. Joint replacements in the past 12 months?no 6. Major health problems in the past 3 months?no 7. Any artificial heart valves, MVP, or defibrillator?no    MEDICATIONS & ALLERGIES:    Patient reports the following regarding taking any anticoagulation/antiplatelet therapy:   Plavix, Coumadin, Eliquis, Xarelto, Lovenox, Pradaxa, Brilinta, or Effient? no Aspirin? no  Patient confirms/reports the following medications:  Current Outpatient Medications  Medication Sig Dispense Refill   amLODipine (NORVASC) 10 MG tablet Take 1 tablet (10 mg total) by mouth daily. 90 tablet 3   Cholecalciferol (VITAMIN D) 50 MCG (2000 UT) CAPS Take 1 capsule (2,000 Units total) by mouth daily. 30 capsule    diclofenac sodium (VOLTAREN) 1 % GEL Apply 2 g topically 2 (two) times daily. For Right knee use 100 g 3   famotidine (PEPCID) 20 MG tablet Take 1 tablet (20 mg total) by mouth 2 (two) times daily before a meal. (Patient not taking: No sig reported) 60 tablet 2   fluticasone (FLONASE) 50 MCG/ACT nasal spray Place 2 sprays into both nostrils daily. May use seasonally 48 g 3   pantoprazole (PROTONIX) 20 MG tablet Take 1 tablet (20 mg total) by mouth daily before breakfast. 90 tablet 3   rosuvastatin (CRESTOR) 10 MG tablet Take 1 tablet (10 mg total) by mouth every other day. 45 tablet 3   sildenafil (REVATIO) 20 MG tablet Take 3-4 pills about 30 min prior to sex. 30 tablet 5   No current facility-administered medications for this visit.    Patient confirms/reports the following allergies:  Allergies  Allergen  Reactions   Shellfish Allergy Hives    No orders of the defined types were placed in this encounter.   AUTHORIZATION INFORMATION Primary Insurance: 1D#: Group #:  Secondary Insurance: 1D#: Group #:  SCHEDULE INFORMATION: Date: 04/10/21 Time: Location: [place]

## 2021-02-28 NOTE — Patient Instructions (Addendum)
Thank you for coming to the office today.  Flu Shot today  Future CoVID19 Booster updated version at pharmacy  Referral to Dr Allen Norris - in [place]. Stay tuned for updates - they should contact you to schedule repeat Colonoscopy, last was done 2017.  [facility]) [address] [place], [place] [address] Main: [phone]  Pantoprazole (stomach acid) and Rosuvastatin (Cholesterol) - may run out or expire at the end of December 2022, double check with pharmacy when ready for more refills.   Please schedule a Follow-up Appointment to: Return in about 6 months (around 08/28/2021) for 6 month follow-up PreDM A1c.  If you have any other questions or concerns, please feel free to call the office or send a message through [place]. You may also schedule an earlier appointment if necessary.  Additionally, you may be receiving a survey about your experience at our office within a few days to 1 week by e-mail or mail. We value your feedback.  Nobie Putnam, DO [place]

## 2021-03-22 ENCOUNTER — Encounter: Payer: Self-pay | Admitting: Gastroenterology

## 2021-04-01 ENCOUNTER — Other Ambulatory Visit: Payer: Self-pay | Admitting: Family Medicine

## 2021-04-01 DIAGNOSIS — E7800 Pure hypercholesterolemia, unspecified: Secondary | ICD-10-CM

## 2021-04-01 NOTE — Telephone Encounter (Signed)
last RF 05/25/20 #45 3 RF

## 2021-04-10 ENCOUNTER — Ambulatory Visit: Payer: Medicare Other | Admitting: Anesthesiology

## 2021-04-10 ENCOUNTER — Ambulatory Visit
Admission: RE | Admit: 2021-04-10 | Discharge: 2021-04-10 | Disposition: A | Discharge status: Home / Self Care | Payer: Medicare Other | Source: Ambulatory Visit | Attending: Gastroenterology | Admitting: Gastroenterology

## 2021-04-10 ENCOUNTER — Encounter
Admission: RE | Disposition: A | Discharge status: Home / Self Care | Payer: Self-pay | Source: Ambulatory Visit | Attending: Gastroenterology

## 2021-04-10 ENCOUNTER — Encounter: Payer: Self-pay | Admitting: Gastroenterology

## 2021-04-10 ENCOUNTER — Other Ambulatory Visit: Payer: Self-pay

## 2021-04-10 DIAGNOSIS — Z79899 Other long term (current) drug therapy: Secondary | ICD-10-CM | POA: Insufficient documentation

## 2021-04-10 DIAGNOSIS — Z87891 Personal history of nicotine dependence: Secondary | ICD-10-CM | POA: Diagnosis not present

## 2021-04-10 DIAGNOSIS — Z1211 Encounter for screening for malignant neoplasm of colon: Secondary | ICD-10-CM | POA: Insufficient documentation

## 2021-04-10 DIAGNOSIS — Z860100 Personal history of colon polyps, unspecified: Secondary | ICD-10-CM

## 2021-04-10 DIAGNOSIS — K648 Other hemorrhoids: Secondary | ICD-10-CM | POA: Diagnosis not present

## 2021-04-10 HISTORY — DX: Essential (primary) hypertension: I10

## 2021-04-10 HISTORY — DX: Unspecified osteoarthritis, unspecified site: M19.90

## 2021-04-10 HISTORY — PX: COLONOSCOPY WITH PROPOFOL: SHX5780

## 2021-04-10 SURGERY — COLONOSCOPY WITH PROPOFOL
Anesthesia: General | Site: Rectum

## 2021-04-10 MED ORDER — STERILE WATER FOR IRRIGATION IR SOLN
Status: DC | PRN
  Administered 2021-04-10: 250 mL

## 2021-04-10 MED ORDER — LIDOCAINE HCL (CARDIAC) PF 100 MG/5ML IV SOSY
PREFILLED_SYRINGE | INTRAVENOUS | Status: DC | PRN
  Administered 2021-04-10: 50 mg via INTRAVENOUS

## 2021-04-10 MED ORDER — LACTATED RINGERS IV SOLN: INTRAVENOUS | Status: DC

## 2021-04-10 MED ORDER — ACETAMINOPHEN 160 MG/5ML PO SOLN: 325.0000 mg | Freq: Once | ORAL | Status: DC

## 2021-04-10 MED ORDER — PROPOFOL 10 MG/ML IV BOLUS
INTRAVENOUS | Status: DC | PRN
  Administered 2021-04-10: 30 mg via INTRAVENOUS
  Administered 2021-04-10: 150 mg via INTRAVENOUS
  Administered 2021-04-10 (×2): 20 mg via INTRAVENOUS
  Administered 2021-04-10 (×3): 40 mg via INTRAVENOUS

## 2021-04-10 MED ORDER — SODIUM CHLORIDE 0.9 % IV SOLN: INTRAVENOUS | Status: DC

## 2021-04-10 MED ORDER — ACETAMINOPHEN 325 MG PO TABS: 325.0000 mg | ORAL_TABLET | Freq: Once | ORAL | Status: DC

## 2021-04-10 SURGICAL SUPPLY — 6 items
GOWN CVR UNV OPN BCK APRN NK (MISCELLANEOUS) ×2 IMPLANT
GOWN ISOL THUMB LOOP REG UNIV (MISCELLANEOUS) ×4
KIT PRC NS LF DISP ENDO (KITS) ×1 IMPLANT
KIT PROCEDURE OLYMPUS (KITS) ×2
MANIFOLD NEPTUNE II (INSTRUMENTS) ×2 IMPLANT
WATER STERILE IRR 250ML POUR (IV SOLUTION) ×2 IMPLANT

## 2021-04-10 NOTE — Transfer of Care (Signed)
Immediate Anesthesia Transfer of Care Note  Patient: Justin Mckee  Procedure(s) Performed: COLONOSCOPY WITH PROPOFOL (Rectum)  Patient Location: PACU  Anesthesia Type: General  Level of Consciousness: awake, alert  and patient cooperative  Airway and Oxygen Therapy: Patient Spontanous Breathing and Patient connected to supplemental oxygen  Post-op Assessment: Post-op Vital signs reviewed, Patient's Cardiovascular Status Stable, Respiratory Function Stable, Patent Airway and No signs of Nausea or vomiting  Post-op Vital Signs: Reviewed and stable  Complications: No notable events documented.

## 2021-04-10 NOTE — Anesthesia Postprocedure Evaluation (Signed)
Anesthesia Post Note  Patient: Justin Mckee  Procedure(s) Performed: COLONOSCOPY WITH PROPOFOL (Rectum)     Patient location during evaluation: PACU Anesthesia Type: General Level of consciousness: awake and alert and oriented Pain management: satisfactory to patient Vital Signs Assessment: post-procedure vital signs reviewed and stable Respiratory status: spontaneous breathing, nonlabored ventilation and respiratory function stable Cardiovascular status: blood pressure returned to baseline and stable Postop Assessment: Adequate PO intake and No signs of nausea or vomiting Anesthetic complications: no   No notable events documented.  Raliegh Ip

## 2021-04-10 NOTE — Anesthesia Procedure Notes (Signed)
Date/Time: 04/10/2021 8:16 AM Performed by: Cameron Ali, CRNA Pre-anesthesia Checklist: Patient identified, Emergency Drugs available, Suction available, Timeout performed and Patient being monitored Patient Re-evaluated:Patient Re-evaluated prior to induction Oxygen Delivery Method: Nasal cannula Placement Confirmation: positive ETCO2

## 2021-04-10 NOTE — Op Note (Signed)
[facility] Patient Name: Justin Mckee Procedure Date: 04/10/2021 8:14 AM MRN: [id number] Account #: [id number] Date of Birth: 1955-06-15 Admit Type: Outpatient Age: 65 Room: [facility] OR ROOM 01 Gender: Male Note Status: Finalized Instrument Name: [id number] Procedure:             Colonoscopy Indications:           High risk colon cancer surveillance: Personal history                         of colonic polyps Providers:             Lucilla Lame MD, MD Referring MD:          Olin Hauser (Referring MD) Medicines:             Propofol per Anesthesia Complications:         No immediate complications. Procedure:             Pre-Anesthesia Assessment:                        - Prior to the procedure, a History and Physical was                         performed, and patient medications and allergies were                         reviewed. The patient's tolerance of previous                         anesthesia was also reviewed. The risks and benefits                         of the procedure and the sedation options and risks                         were discussed with the patient. All questions were                         answered, and informed consent was obtained. Prior                         Anticoagulants: The patient has taken no previous                         anticoagulant or antiplatelet agents. ASA Grade                         Assessment: II - A patient with mild systemic disease.                         After reviewing the risks and benefits, the patient                         was deemed in satisfactory condition to undergo the                         procedure.  After obtaining informed consent, the colonoscope was                         passed under direct vision. Throughout the procedure,                         the patient's blood pressure, pulse, and oxygen                         saturations were monitored  continuously. The                         Colonoscope was introduced through the anus and                         advanced to the the cecum, identified by appendiceal                         orifice and ileocecal valve. The colonoscopy was                         performed without difficulty. The patient tolerated                         the procedure well. The quality of the bowel                         preparation was excellent. Findings:      The perianal and digital rectal examinations were normal.      Non-bleeding internal hemorrhoids were found during retroflexion. The       hemorrhoids were Grade I (internal hemorrhoids that do not prolapse). Impression:            - Non-bleeding internal hemorrhoids.                        - No specimens collected. Recommendation:        - Discharge patient to home.                        - Resume previous diet.                        - Continue present medications.                        - Repeat colonoscopy in 10 years for surveillance. Procedure Code(s):     --- Professional ---                        [phone], Colonoscopy, flexible; diagnostic, including                         collection of specimen(s) by brushing or washing, when                         performed (separate procedure) Diagnosis Code(s):     --- Professional ---                        Z86.010, Personal history of colonic polyps  CPT copyright 2019 American Medical Association. All rights reserved. The codes documented in this report are preliminary and upon coder review may  be revised to meet current compliance requirements. Lucilla Lame MD, MD 04/10/2021 8:33:04 AM This report has been signed electronically. Number of Addenda: 0 Note Initiated On: 04/10/2021 8:14 AM Scope Withdrawal Time: 0 hours 8 minutes 29 seconds  Total Procedure Duration: 0 hours 11 minutes 13 seconds  Estimated Blood Loss:  Estimated blood loss: none.      [facility]

## 2021-04-10 NOTE — Anesthesia Preprocedure Evaluation (Signed)
Anesthesia Evaluation  Patient identified by MRN, date of birth, ID band Patient awake    Reviewed: Allergy & Precautions, H&P , NPO status , Patient's Chart, lab work & pertinent test results  Airway Mallampati: II  TM Distance: >3 FB Neck ROM: full    Dental  (+) Edentulous Upper, Poor Dentition   Pulmonary former smoker,    Pulmonary exam normal breath sounds clear to auscultation       Cardiovascular hypertension, Normal cardiovascular exam Rhythm:regular Rate:Normal     Neuro/Psych    GI/Hepatic GERD  ,  Endo/Other    Renal/GU      Musculoskeletal   Abdominal   Peds  Hematology   Anesthesia Other Findings   Reproductive/Obstetrics                             Anesthesia Physical Anesthesia Plan  ASA: 2  Anesthesia Plan: General   Post-op Pain Management:    Induction: Intravenous  PONV Risk Score and Plan: 2 and Treatment may vary due to age or medical condition, Propofol infusion and TIVA  Airway Management Planned: Natural Airway  Additional Equipment:   Intra-op Plan:   Post-operative Plan:   Informed Consent: I have reviewed the patients History and Physical, chart, labs and discussed the procedure including the risks, benefits and alternatives for the proposed anesthesia with the patient or authorized representative who has indicated his/her understanding and acceptance.     Dental Advisory Given  Plan Discussed with: CRNA  Anesthesia Plan Comments:         Anesthesia Quick Evaluation
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
## 2021-06-19 DIAGNOSIS — H524 Presbyopia: Secondary | ICD-10-CM | POA: Diagnosis not present

## 2021-07-10 ENCOUNTER — Other Ambulatory Visit: Payer: Self-pay | Admitting: Family Medicine

## 2021-07-10 DIAGNOSIS — K219 Gastro-esophageal reflux disease without esophagitis: Secondary | ICD-10-CM

## 2021-07-10 NOTE — Telephone Encounter (Signed)
Requested medication (s) are due for refill today: yes  Requested medication (s) are on the active medication list: no  Last refill:  04/18/21  Future visit scheduled: yes  Notes to clinic:  pt was dc off Prilosec and placed on Pepcid and Protonix. Please advise     Requested Prescriptions  Pending Prescriptions Disp Refills   omeprazole (PRILOSEC) 20 MG capsule [Pharmacy Med Name: OMEPRAZOLE DR 20 MG CAPSULE] 90 capsule 3    Sig: TAKE 1 CAPSULE BY MOUTH DAILY BEFORE BREAKFAST     Gastroenterology: Proton Pump Inhibitors Passed - 07/10/2021  9:45 AM      Passed - Valid encounter within last 12 months    Recent Outpatient Visits           4 months ago Annual physical exam   [facility] Olin Hauser, DO   9 months ago Essential hypertension   [place], Devonne Doughty, DO   1 year ago Pure hypercholesterolemia   [place], Devonne Doughty, DO   1 year ago Annual physical exam   [facility] Olin Hauser, DO   1 year ago Essential hypertension   [place], Devonne Doughty, DO       Future Appointments             In 1 month [place], Devonne Doughty, [facility], [facility]

## 2021-08-22 ENCOUNTER — Other Ambulatory Visit: Payer: Self-pay

## 2021-08-22 ENCOUNTER — Encounter: Payer: Self-pay | Admitting: Family Medicine

## 2021-08-22 ENCOUNTER — Other Ambulatory Visit: Payer: Self-pay | Admitting: Family Medicine

## 2021-08-22 ENCOUNTER — Ambulatory Visit (INDEPENDENT_AMBULATORY_CARE_PROVIDER_SITE_OTHER): Payer: Medicare Other | Admitting: Family Medicine

## 2021-08-22 VITALS — BP 142/84 | HR 68 | Ht 70.0 in | Wt 190.4 lb

## 2021-08-22 DIAGNOSIS — I10 Essential (primary) hypertension: Secondary | ICD-10-CM

## 2021-08-22 DIAGNOSIS — E7800 Pure hypercholesterolemia, unspecified: Secondary | ICD-10-CM

## 2021-08-22 DIAGNOSIS — K219 Gastro-esophageal reflux disease without esophagitis: Secondary | ICD-10-CM

## 2021-08-22 DIAGNOSIS — R1032 Left lower quadrant pain: Secondary | ICD-10-CM

## 2021-08-22 MED ORDER — AMLODIPINE BESYLATE 10 MG PO TABS: 10.0000 mg | ORAL_TABLET | Freq: Every day | ORAL | 3 refills | Status: DC

## 2021-08-22 MED ORDER — ROSUVASTATIN CALCIUM 10 MG PO TABS: 10.0000 mg | ORAL_TABLET | ORAL | 3 refills | Status: DC

## 2021-08-22 MED ORDER — BACLOFEN 10 MG PO TABS: 5.0000 mg | ORAL_TABLET | Freq: Two times a day (BID) | ORAL | 1 refills | Status: DC | PRN

## 2021-08-22 MED ORDER — PANTOPRAZOLE SODIUM 20 MG PO TBEC: 20.0000 mg | DELAYED_RELEASE_TABLET | Freq: Every day | ORAL | 3 refills | Status: DC

## 2021-08-22 NOTE — Progress Notes (Signed)
? ?Subjective:  ? ? Patient ID: Justin Mckee, male    DOB: 06/02/1956, 66 y.o.   MRN: [id number] ? ?Justin Mckee is a 66 y.o. male presenting on 08/22/2021 for Groin Pain ? ? ?HPI ? ?Left Inguinal Groin Pain ?Reports recent acute Left groin pain with scrotal swelling and pain in inguinal region, seems to have improved some. He does do some heavy lifting at times, but nothing at time of injury. Seems to have episodes of pain worse with bending turning twisting. But overall some improved. No further swelling or bulging. ?No prior hernia in this area ?He has history of laparoscopic prostatectomy due to prostate cancer. ? ?Past Surgical History:  ?Procedure Laterality Date  ? COLONOSCOPY WITH PROPOFOL N/A 01/06/2016  ? Procedure: COLONOSCOPY WITH PROPOFOL;  Surgeon: Lucilla Lame, MD;  Location: [place];  Service: Endoscopy;  Laterality: N/A;  ? COLONOSCOPY WITH PROPOFOL N/A 04/10/2021  ? Procedure: COLONOSCOPY WITH PROPOFOL;  Surgeon: Lucilla Lame, MD;  Location: [place];  Service: Endoscopy;  Laterality: N/A;  ? POLYPECTOMY  01/06/2016  ? Procedure: POLYPECTOMY;  Surgeon: Lucilla Lame, MD;  Location: [place];  Service: Endoscopy;;  descending colon polyp  ? PROSTATECTOMY    ? ? ?Depression screen [facility] 2/9 08/22/2021 05/25/2020 02/19/2020  ?Decreased Interest 0 0 0  ?Down, Depressed, Hopeless 0 0 0  ?PHQ - 2 Score 0 0 0  ?Altered sleeping 1 - -  ?Tired, decreased energy 0 - -  ?Change in appetite 0 - -  ?Feeling bad or failure about yourself  0 - -  ?Trouble concentrating 0 - -  ?Moving slowly or fidgety/restless 0 - -  ?Suicidal thoughts 0 - -  ?PHQ-9 Score 1 - -  ?Difficult doing work/chores Not difficult at all - -  ? ? ?Social History  ? ?Tobacco Use  ? Smoking status: Former  ?  Packs/day: 1.00  ?  Years: 40.00  ?  Pack years: 40.00  ?  Types: Cigarettes  ?  Quit date: 06/01/2016  ?  Years since quitting: 5.2  ? Smokeless tobacco: Former  ?  Quit date: 08/28/2015  ?Vaping Use  ? Vaping Use:  Never used  ?Substance Use Topics  ? Alcohol use: Yes  ?  Alcohol/week: 21.0 standard drinks  ?  Types: 21 Cans of beer per week  ?  Comment:    ? Drug use: No  ? ? ?Review of Systems ?Per HPI unless specifically indicated above ? ?   ?Objective:  ?  ?BP (!) 142/84 (BP Location: Left Arm, Cuff Size: Normal)   Pulse 68   Ht '5\' 10"'  (1.778 m)   Wt 190 lb 6.4 oz (86.4 kg)   SpO2 99%   BMI 27.32 kg/m?   ?Wt Readings from Last 3 Encounters:  ?08/22/21 190 lb 6.4 oz (86.4 kg)  ?04/10/21 186 lb (84.4 kg)  ?02/28/21 192 lb (87.1 kg)  ?  ?Physical Exam ?Vitals and nursing note reviewed.  ?Constitutional:   ?   General: He is not in acute distress. ?   Appearance: Normal appearance. He is well-developed. He is not diaphoretic.  ?   Comments: Well-appearing, comfortable, cooperative  ?HENT:  ?   Head: Normocephalic and atraumatic.  ?Eyes:  ?   General:     ?   Right eye: No discharge.     ?   Left eye: No discharge.  ?   Conjunctiva/sclera: Conjunctivae normal.  ?Cardiovascular:  ?   Rate and Rhythm:  Normal rate.  ?Pulmonary:  ?   Effort: Pulmonary effort is normal.  ?Genitourinary: ?   Comments: Left inguinal exam, no hernia palpated on exam. Not provoked with valsalva maneuvers. ?No scrotal swelling ?Skin: ?   General: Skin is warm and dry.  ?   Findings: No erythema or rash.  ?Neurological:  ?   Mental Status: He is alert and oriented to person, place, and time.  ?Psychiatric:     ?   Mood and Affect: Mood normal.     ?   Behavior: Behavior normal.     ?   Thought Content: Thought content normal.  ?   Comments: Well groomed, good eye contact, normal speech and thoughts  ? ? ? ?Results for orders placed or performed in visit on 02/20/21  ?PSA  ?Result Value Ref Range  ? PSA 0.16 < OR = 4.00 ng/mL  ?Lipid panel  ?Result Value Ref Range  ? Cholesterol 171 <200 mg/dL  ? HDL 52 > OR = 40 mg/dL  ? Triglycerides 71 <150 mg/dL  ? LDL Cholesterol (Calc) 103 (H) mg/dL (calc)  ? Total CHOL/HDL Ratio 3.3 <5.0 (calc)  ? Non-HDL  Cholesterol (Calc) 119 <130 mg/dL (calc)  ?COMPLETE METABOLIC PANEL WITH GFR  ?Result Value Ref Range  ? Glucose, Bld 117 (H) 65 - 99 mg/dL  ? BUN 13 7 - 25 mg/dL  ? Creat 1.04 0.70 - 1.35 mg/dL  ? eGFR 80 > OR = 60 mL/min/1.29m  ? BUN/Creatinine Ratio NOT APPLICABLE 6 - 22 (calc)  ? Sodium 137 135 - 146 mmol/L  ? Potassium 4.2 3.5 - 5.3 mmol/L  ? Chloride 103 98 - 110 mmol/L  ? CO2 27 20 - 32 mmol/L  ? Calcium 9.5 8.6 - 10.3 mg/dL  ? Total Protein 7.2 6.1 - 8.1 g/dL  ? Albumin 4.5 3.6 - 5.1 g/dL  ? Globulin 2.7 1.9 - 3.7 g/dL (calc)  ? AG Ratio 1.7 1.0 - 2.5 (calc)  ? Total Bilirubin 0.7 0.2 - 1.2 mg/dL  ? Alkaline phosphatase (APISO) 34 (L) 35 - 144 U/L  ? AST 22 10 - 35 U/L  ? ALT 20 9 - 46 U/L  ?CBC with Differential/Platelet  ?Result Value Ref Range  ? WBC 5.1 3.8 - 10.8 Thousand/uL  ? RBC 4.35 4.20 - 5.80 Million/uL  ? Hemoglobin 13.2 13.2 - 17.1 g/dL  ? HCT 40.2 38.5 - 50.0 %  ? MCV 92.4 80.0 - 100.0 fL  ? MCH 30.3 27.0 - 33.0 pg  ? MCHC 32.8 32.0 - 36.0 g/dL  ? RDW 14.1 11.0 - 15.0 %  ? Platelets 309 140 - 400 Thousand/uL  ? MPV 10.0 7.5 - 12.5 fL  ? Neutro Abs 2,458 1,500 - 7,800 cells/uL  ? Lymphs Abs 1,933 850 - 3,900 cells/uL  ? Absolute Monocytes 587 200 - 950 cells/uL  ? Eosinophils Absolute 82 15 - 500 cells/uL  ? Basophils Absolute 41 0 - 200 cells/uL  ? Neutrophils Relative % 48.2 %  ? Total Lymphocyte 37.9 %  ? Monocytes Relative 11.5 %  ? Eosinophils Relative 1.6 %  ? Basophils Relative 0.8 %  ?Hemoglobin A1c  ?Result Value Ref Range  ? Hgb A1c MFr Bld 6.1 (H) <5.7 % of total Hgb  ? Mean Plasma Glucose 128 mg/dL  ? eAG (mmol/L) 7.1 mmol/L  ?VITAMIN D 25 Hydroxy (Vit-D Deficiency, Fractures)  ?Result Value Ref Range  ? Vit D, 25-Hydroxy 21 (L) 30 - 100 ng/mL  ? ?   ?  Assessment & Plan:  ? ?Problem List Items Addressed This Visit   ?None ?Visit Diagnoses   ? ? Left groin pain    -  Primary  ? ?  ?  ?Exam today not showing evidence of L inguinal hernia, however pattern of pain is suggestive. ?Seems  to have mostly MSK related pain with movement / triggers provoking. ? ?He has prior laparoscopic prostatectomy in this region however symptoms do not seem related to post op scenario. ? ?No obvious scrotal swelling or abnormality today ? ?No obvious herniation or acute injury ?Reassurance today ?Close monitoring progression of symptoms ?Trial on Baclofen 5-27m BID PRN caution sedation ?Tylenol / Ibuprofen dosing ?Ice/heat relative rest ? ?If persistent, can consider referral to Gen Surgery for consultation. May have early developing inguinal hernia. ? ?No orders of the defined types were placed in this encounter. ? ? ? ? ?Follow up plan: ?Return for may keep apt 3/21 if not improved, otherwise can re-schedule +1 month later. ? ?ANobie Putnam DO ?[facility] ?08/22/2021, 10:05 AM ?

## 2021-08-22 NOTE — Patient Instructions (Addendum)
Thank you for coming to the office today. ? ?I am not convinced it is an actual hernia today ? ?May be muscle strain and early pains in this region but not quite hernia ? ?Start taking Baclofen (Lioresal) '10mg'$  (muscle relaxant) - start with half (cut) to one whole pill at night as needed for next 1-3 nights (may make you drowsy, caution with driving) see how it affects you, then if tolerated increase to one pill 2 to 3 times a day or (every 8 hours as needed) ? ?Recommend a "Hernia Truss", try to wear this regularly (do not need to wear to bed if pain is improved), until it feels better and then only with activities ?- If it is not improving then you may need to wear it every day, especially if you cannot have a surgery to fix the hernia ?  ?You can find a Truss OTC at [employer] ?  ?May take Tylenol as needed. Recommend to start taking Tylenol Extra Strength '500mg'$  tabs - take 1 to 2 tabs per dose (max '1000mg'$ ) every 6-8 hours for pain (take regularly, don't skip a dose for next 7 days), max 24 hour daily dose is 6 tablets or '3000mg'$ . In the future you can repeat the same everyday Tylenol course for 1-2 weeks at a time.  ? ?Add Ibuprofen 400-'600mg'$  with meal up to 3 times a day as needed ?  ?Can try topical ice packs or muscle rub if burning nerve sensation. ?   ?If significant worsening pain or you get bulging that does NOT go down or go away and CANNOT push back in, or nausea, vomiting, then it is very important to go directly to hospital ED for more immediate evaluation, as this can be a life threatening surgical emergency ?  ? ?Please schedule a Follow-up Appointment to: Return for may keep apt 3/21 if not improved, otherwise can re-schedule +1 month later. ? ?If you have any other questions or concerns, please feel free to call the office or send a message through [place]. You may also schedule an earlier appointment if necessary. ? ?Additionally, you may be receiving a survey about your experience at our office  within a few days to 1 week by e-mail or mail. We value your feedback. ? ?Nobie Putnam, DO ?[place] ?

## 2021-08-28 ENCOUNTER — Telehealth: Payer: Self-pay

## 2021-08-28 NOTE — Telephone Encounter (Signed)
Attempted to contact the patient, no answer. Left message for patient to call back and schedule the Medicare Annual Wellness Visit (AWV) virtually, telephone or face to face. ?  ?Donnie Mesa, [place] ?[phone] ?

## 2021-08-29 ENCOUNTER — Other Ambulatory Visit: Payer: Self-pay

## 2021-08-29 ENCOUNTER — Ambulatory Visit (INDEPENDENT_AMBULATORY_CARE_PROVIDER_SITE_OTHER): Payer: Medicare Other | Admitting: Family Medicine

## 2021-08-29 ENCOUNTER — Other Ambulatory Visit: Payer: Self-pay | Admitting: Family Medicine

## 2021-08-29 ENCOUNTER — Encounter: Payer: Self-pay | Admitting: Family Medicine

## 2021-08-29 VITALS — BP 138/84 | HR 64 | Ht 70.0 in | Wt 191.4 lb

## 2021-08-29 DIAGNOSIS — E7800 Pure hypercholesterolemia, unspecified: Secondary | ICD-10-CM | POA: Diagnosis not present

## 2021-08-29 DIAGNOSIS — Z23 Encounter for immunization: Secondary | ICD-10-CM

## 2021-08-29 DIAGNOSIS — I10 Essential (primary) hypertension: Secondary | ICD-10-CM | POA: Diagnosis not present

## 2021-08-29 DIAGNOSIS — R1032 Left lower quadrant pain: Secondary | ICD-10-CM

## 2021-08-29 DIAGNOSIS — R7303 Prediabetes: Secondary | ICD-10-CM

## 2021-08-29 NOTE — Patient Instructions (Addendum)
Thank you for coming to the office today. ? ?A1c lab test today, stay tuned for results on mychart ? ?Recent Labs  ?  09/27/20 ?1623 02/21/21 ?0840  ?HGBA1C 6.3* 6.1*  ? ?Keep on track with BP medicine, measure BP occasionally to keep an eye on it. ? ?Prevnar-20 pneumonia vaccine today, last one you will need. ? ?DUE for FASTING BLOOD WORK (no food or drink after midnight before the lab appointment, only water or coffee without cream/sugar on the morning of) ? ?SCHEDULE "Lab Only" visit in the morning at the clinic for lab draw in 6 MONTHS  ? ?- Make sure Lab Only appointment is at about 1 week before your next appointment, so that results will be available ? ?For Lab Results, once available within 2-3 days of blood draw, you can can log in to MyChart online to view your results and a brief explanation. Also, we can discuss results at next follow-up visit. ? ? ?Please schedule a Follow-up Appointment to: Return in about 6 months (around 03/01/2022) for 6 month Annual Physical AM apt fasting lab AFTER. ? ?If you have any other questions or concerns, please feel free to call the office or send a message through [place]. You may also schedule an earlier appointment if necessary. ? ?Additionally, you may be receiving a survey about your experience at our office within a few days to 1 week by e-mail or mail. We value your feedback. ? ?Nobie Putnam, DO ?[place] ?

## 2021-08-29 NOTE — Assessment & Plan Note (Signed)
A1c to 6.1 on last lab ?Due for A1c ? ?Plan:  ?1. Not on any therapy currently ?2. Encourage improved lifestyle - low carb, low sugar diet, reduce portion size, continue improving regular exercise ?

## 2021-08-29 NOTE — Progress Notes (Signed)
? ?Subjective:  ? ? Patient ID: Justin Mckee, male    DOB: 09-02-1955, 66 y.o.   MRN: [id number] ? ?Justin Mckee is a 66 y.o. male presenting on 08/29/2021 for Prediabetes ? ? ?HPI ? ?Chronic Hypertension ?Checking BP at home avg 130-140/80s, doing well, no concerns. ?Meds - Amlodipine 32m daily ?Denies Headaches, CP, dyspnea, edema, dizziness / lightheadedness, numbness weakness, hearing loss ?  ?HYPERLIPIDEMIA: ?- Reports no concerns. Last lipid panel 02/2021, improved on Statin however had symptoms was switched to intermittent ?- Currently taking Rosuvastatin 198mevery OTHER day or up to every 3rd day now, tolerating well with only mild myalgia ache ?  ?Pre-Diabetes ?Improved A1c 6.1, previous 6.3 to 6.6 ?Meds: never on meds ?Lifestyle ?Weight up ?- Diet (Reduce sodas) ?- Exercise (increased exercise) ?Denies hypoglycemia, polyuria ?  ? ?Health Maintenance: ? ?Screening Colon Cancer: last 01/06/16 had Colonoscopy Dr WoAllen NorrisAGI), had adenoma polyp, was advise to return in 5 years, approx 12/2020 now he is due ? ? ?[facility] 08/22/2021 05/25/2020 02/19/2020  ?Decreased Interest 0 0 0  ?Down, Depressed, Hopeless 0 0 0  ?PHQ - 2 Score 0 0 0  ?Altered sleeping 1 - -  ?Tired, decreased energy 0 - -  ?Change in appetite 0 - -  ?Feeling bad or failure about yourself  0 - -  ?Trouble concentrating 0 - -  ?Moving slowly or fidgety/restless 0 - -  ?Suicidal thoughts 0 - -  ?PHQ-9 Score 1 - -  ?Difficult doing work/chores Not difficult at all - -  ? ? ?Social History  ? ?Tobacco Use  ? Smoking status: Former  ?  Packs/day: 1.00  ?  Years: 40.00  ?  Pack years: 40.00  ?  Types: Cigarettes  ?  Quit date: 06/01/2016  ?  Years since quitting: 5.2  ? Smokeless tobacco: Former  ?  Quit date: 08/28/2015  ?Vaping Use  ? Vaping Use: Never used  ?Substance Use Topics  ? Alcohol use: Yes  ?  Alcohol/week: 21.0 standard drinks  ?  Types: 21 Cans of beer per week  ?  Comment:    ? Drug use: No  ? ? ?Review of Systems ?Per HPI unless  specifically indicated above ? ?   ?Objective:  ?  ?BP 138/84 (BP Location: Left Arm, Cuff Size: Normal)   Pulse 64   Ht '5\' 10"'  (1.778 m)   Wt 191 lb 6.4 oz (86.8 kg)   SpO2 99%   BMI 27.46 kg/m?   ?Wt Readings from Last 3 Encounters:  ?08/29/21 191 lb 6.4 oz (86.8 kg)  ?08/22/21 190 lb 6.4 oz (86.4 kg)  ?04/10/21 186 lb (84.4 kg)  ?  ?Physical Exam ?Vitals and nursing note reviewed.  ?Constitutional:   ?   General: He is not in acute distress. ?   Appearance: Normal appearance. He is well-developed. He is not diaphoretic.  ?   Comments: Well-appearing, comfortable, cooperative  ?HENT:  ?   Head: Normocephalic and atraumatic.  ?Eyes:  ?   General:     ?   Right eye: No discharge.     ?   Left eye: No discharge.  ?   Conjunctiva/sclera: Conjunctivae normal.  ?Cardiovascular:  ?   Rate and Rhythm: Normal rate.  ?Pulmonary:  ?   Effort: Pulmonary effort is normal.  ?Skin: ?   General: Skin is warm and dry.  ?   Findings: No erythema or rash.  ?Neurological:  ?  Mental Status: He is alert and oriented to person, place, and time.  ?Psychiatric:     ?   Mood and Affect: Mood normal.     ?   Behavior: Behavior normal.     ?   Thought Content: Thought content normal.  ?   Comments: Well groomed, good eye contact, normal speech and thoughts  ? ? ? ?Results for orders placed or performed in visit on 02/20/21  ?PSA  ?Result Value Ref Range  ? PSA 0.16 < OR = 4.00 ng/mL  ?Lipid panel  ?Result Value Ref Range  ? Cholesterol 171 <200 mg/dL  ? HDL 52 > OR = 40 mg/dL  ? Triglycerides 71 <150 mg/dL  ? LDL Cholesterol (Calc) 103 (H) mg/dL (calc)  ? Total CHOL/HDL Ratio 3.3 <5.0 (calc)  ? Non-HDL Cholesterol (Calc) 119 <130 mg/dL (calc)  ?COMPLETE METABOLIC PANEL WITH GFR  ?Result Value Ref Range  ? Glucose, Bld 117 (H) 65 - 99 mg/dL  ? BUN 13 7 - 25 mg/dL  ? Creat 1.04 0.70 - 1.35 mg/dL  ? eGFR 80 > OR = 60 mL/min/1.26m  ? BUN/Creatinine Ratio NOT APPLICABLE 6 - 22 (calc)  ? Sodium 137 135 - 146 mmol/L  ? Potassium 4.2 3.5 -  5.3 mmol/L  ? Chloride 103 98 - 110 mmol/L  ? CO2 27 20 - 32 mmol/L  ? Calcium 9.5 8.6 - 10.3 mg/dL  ? Total Protein 7.2 6.1 - 8.1 g/dL  ? Albumin 4.5 3.6 - 5.1 g/dL  ? Globulin 2.7 1.9 - 3.7 g/dL (calc)  ? AG Ratio 1.7 1.0 - 2.5 (calc)  ? Total Bilirubin 0.7 0.2 - 1.2 mg/dL  ? Alkaline phosphatase (APISO) 34 (L) 35 - 144 U/L  ? AST 22 10 - 35 U/L  ? ALT 20 9 - 46 U/L  ?CBC with Differential/Platelet  ?Result Value Ref Range  ? WBC 5.1 3.8 - 10.8 Thousand/uL  ? RBC 4.35 4.20 - 5.80 Million/uL  ? Hemoglobin 13.2 13.2 - 17.1 g/dL  ? HCT 40.2 38.5 - 50.0 %  ? MCV 92.4 80.0 - 100.0 fL  ? MCH 30.3 27.0 - 33.0 pg  ? MCHC 32.8 32.0 - 36.0 g/dL  ? RDW 14.1 11.0 - 15.0 %  ? Platelets 309 140 - 400 Thousand/uL  ? MPV 10.0 7.5 - 12.5 fL  ? Neutro Abs 2,458 1,500 - 7,800 cells/uL  ? Lymphs Abs 1,933 850 - 3,900 cells/uL  ? Absolute Monocytes 587 200 - 950 cells/uL  ? Eosinophils Absolute 82 15 - 500 cells/uL  ? Basophils Absolute 41 0 - 200 cells/uL  ? Neutrophils Relative % 48.2 %  ? Total Lymphocyte 37.9 %  ? Monocytes Relative 11.5 %  ? Eosinophils Relative 1.6 %  ? Basophils Relative 0.8 %  ?Hemoglobin A1c  ?Result Value Ref Range  ? Hgb A1c MFr Bld 6.1 (H) <5.7 % of total Hgb  ? Mean Plasma Glucose 128 mg/dL  ? eAG (mmol/L) 7.1 mmol/L  ?VITAMIN D 25 Hydroxy (Vit-D Deficiency, Fractures)  ?Result Value Ref Range  ? Vit D, 25-Hydroxy 21 (L) 30 - 100 ng/mL  ? ?   ?Assessment & Plan:  ? ?Problem List Items Addressed This Visit   ? ? Pure hypercholesterolemia  ?  Improved on Statin therapy ?on intermittent dosing still, tolerating well some mild myalgia ?Last lipid panel 12.2021 ?The 10-year ASCVD risk score (Arnett DK, et al., 2019) is: 17.4% ? ?Plan: ?1. Continue on Rosuvastatin 15mEVERY  OTHER DAY ?2. Encourage improved lifestyle - low carb/cholesterol, reduce portion size, continue improving regular exercise ?  ?  ? Pre-diabetes  ?  A1c to 6.1 on last lab ?Due for A1c ? ?Plan:  ?1. Not on any therapy currently ?2.  Encourage improved lifestyle - low carb, low sugar diet, reduce portion size, continue improving regular exercise ?  ?  ? Relevant Orders  ? Hemoglobin A1c  ? Essential hypertension - Primary  ?  Improved BP ?Home BP mild elevated ?Remains off BP Med ? No known complications  ?Previously on Amlodipine ?  ?Plan:  ?1. Continue Amlodipine 103m daily HTN therapy ?2. Encourage improved lifestyle - low sodium diet, regular exercise ?3. Continue monitor BP outside office, bring readings to next visit, if persistently >140/90 or new symptoms notify office sooner ?  ?  ? ?Other Visit Diagnoses   ? ? Need for pneumococcal vaccination      ? Relevant Orders  ? Pneumococcal conjugate vaccine 20-valent (Completed)  ? ?  ?  ?Orders Placed This Encounter  ?Procedures  ? Pneumococcal conjugate vaccine 20-valent  ? Hemoglobin A1c  ? ? ? ?No orders of the defined types were placed in this encounter. ? ? ? ? ?Follow up plan: ?Return in about 6 months (around 03/01/2022) for 6 month Annual Physical AM apt fasting lab AFTER. ? ? ?Justin Putnam DO ?[facility] ?08/29/2021, 8:12 AM ?

## 2021-08-29 NOTE — Assessment & Plan Note (Signed)
Improved BP ?Home BP mild elevated ?Remains off BP Med ? No known complications  ?Previously on Amlodipine ?  ?Plan:  ?1. Continue Amlodipine '10mg'$  daily HTN therapy ?2. Encourage improved lifestyle - low sodium diet, regular exercise ?3. Continue monitor BP outside office, bring readings to next visit, if persistently >140/90 or new symptoms notify office sooner ?

## 2021-08-29 NOTE — Assessment & Plan Note (Signed)
Improved on Statin therapy ?on intermittent dosing still, tolerating well some mild myalgia ?Last lipid panel 12.2021 ?The 10-year ASCVD risk score (Arnett DK, et al., 2019) is: 17.4% ? ?Plan: ?1. Continue on Rosuvastatin '10mg'$  EVERY OTHER DAY ?2. Encourage improved lifestyle - low carb/cholesterol, reduce portion size, continue improving regular exercise ?

## 2021-08-30 LAB — HEMOGLOBIN A1C
Hgb A1c MFr Bld: 6.3 % of total Hgb — ABNORMAL HIGH (ref <5.7)
Mean Plasma Glucose: 134 mg/dL
eAG (mmol/L): 7.4 mmol/L

## 2021-08-31 NOTE — Telephone Encounter (Signed)
Resent as first transmittal did not go through. ? ?Requested Prescriptions  ?Pending Prescriptions Disp Refills  ?? baclofen (LIORESAL) 10 MG tablet [Pharmacy Med Name: BACLOFEN 10 MG TABLET] 30 tablet 1  ?  Sig: TAKE 1/2 TO 1 TABLET (5-10 MG TOTAL) BY MOUTH 2 (TWO) TIMES DAILY AS NEEDED FOR MUSCLE SPASMS.  ?  ? Analgesics:  Muscle Relaxants - baclofen Failed - 08/29/2021 12:31 PM  ?  ?  Failed - Cr in normal range and within 180 days  ?  Creat  ?Date Value Ref Range Status  ?02/21/2021 1.04 0.70 - 1.35 mg/dL Final  ?   ?  ?  Failed - eGFR is 30 or above and within 180 days  ?  GFR, Est African American  ?Date Value Ref Range Status  ?05/25/2020 83 > OR = 60 mL/min/1.52m Final  ? ?GFR, Est Non African American  ?Date Value Ref Range Status  ?05/25/2020 71 > OR = 60 mL/min/1.796mFinal  ? ?eGFR  ?Date Value Ref Range Status  ?02/21/2021 80 > OR = 60 mL/min/1.7375minal  ?  Comment:  ?  The eGFR is based on the CKD-EPI 2021 equation. To calculate  ?the new eGFR from a previous Creatinine or Cystatin C ?result, go to https://www.kidney.org/professionals/ ?kdoqi/gfr%5Fcalculator ?  ?   ?  ?  Passed - Valid encounter within last 6 months  ?  Recent Outpatient Visits   ?      ? 2 days ago Essential hypertension  ? [facility]  ? 1 week ago Left groin pain  ? [facility]  ? 6 months ago Annual physical exam  ? [facility]  ? 11 months ago Essential hypertension  ? [facility]  ? 1 year ago Pure hypercholesterolemia  ? [facility]  ?  ?  ?Future Appointments   ?        ? In 6 months [facility]  ? ?  ?  ?  ? ?

## 2021-09-04 ENCOUNTER — Other Ambulatory Visit: Payer: Self-pay

## 2021-09-04 ENCOUNTER — Ambulatory Visit (INDEPENDENT_AMBULATORY_CARE_PROVIDER_SITE_OTHER): Payer: Medicare Other

## 2021-09-04 DIAGNOSIS — Z87891 Personal history of nicotine dependence: Secondary | ICD-10-CM | POA: Diagnosis not present

## 2021-09-04 DIAGNOSIS — Z0000 Encounter for general adult medical examination without abnormal findings: Secondary | ICD-10-CM | POA: Diagnosis not present

## 2021-09-04 DIAGNOSIS — R918 Other nonspecific abnormal finding of lung field: Secondary | ICD-10-CM
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
## 2021-09-04 NOTE — Patient Instructions (Signed)

## 2021-09-09 ENCOUNTER — Other Ambulatory Visit: Payer: Self-pay | Admitting: Family Medicine

## 2021-09-09 DIAGNOSIS — R1032 Left lower quadrant pain: Secondary | ICD-10-CM

## 2021-09-11 NOTE — Telephone Encounter (Signed)
Requested medication (s) are due for refill today:Yes ? ?Requested medication (s) are on the active medication list: yes  ? ?Last refill:  08/29/21 ? ?Future visit scheduled: yes ? ?Notes to clinic:  Routing to PCP to adjust medication dosage and quantity. See notes from 08/21/21. ? ? ?  ?Requested Prescriptions  ?Pending Prescriptions Disp Refills  ? baclofen (LIORESAL) 10 MG tablet [Pharmacy Med Name: BACLOFEN 10 MG TABLET] 180 tablet 1  ?  Sig: TAKE 1/2 TO 1 TABLET (5-10 MG TOTAL) BY MOUTH 2 (TWO) TIMES DAILY AS NEEDED FOR MUSCLE SPASMS.  ?  ? Analgesics:  Muscle Relaxants - baclofen Failed - 09/09/2021 10:07 AM  ?  ?  Failed - Cr in normal range and within 180 days  ?  Creat  ?Date Value Ref Range Status  ?02/21/2021 1.04 0.70 - 1.35 mg/dL Final  ?  ?  ?  ?  Failed - eGFR is 30 or above and within 180 days  ?  GFR, Est African American  ?Date Value Ref Range Status  ?05/25/2020 83 > OR = 60 mL/min/1.13m Final  ? ?GFR, Est Non African American  ?Date Value Ref Range Status  ?05/25/2020 71 > OR = 60 mL/min/1.728mFinal  ? ?eGFR  ?Date Value Ref Range Status  ?02/21/2021 80 > OR = 60 mL/min/1.7343minal  ?  Comment:  ?  The eGFR is based on the CKD-EPI 2021 equation. To calculate  ?the new eGFR from a previous Creatinine or Cystatin C ?result, go to https://www.kidney.org/professionals/ ?kdoqi/gfr%5Fcalculator ?  ?  ?  ?  ?  Passed - Valid encounter within last 6 months  ?  Recent Outpatient Visits   ? ?      ? 1 week ago Essential hypertension  ? [facility]  ? 2 weeks ago Left groin pain  ? [facility]  ? 6 months ago Annual physical exam  ? [facility]  ? 11 months ago Essential hypertension  ? [facility]  ? 1 year ago Pure hypercholesterolemia  ? [facility]  ? ?  ?  ?Future Appointments    ? ?        ? In 5 months [facility] ?  ? ?  ?  ?  ? ? ?

## 2021-09-14 NOTE — Addendum Note (Signed)
Addended by: Wilson Singer on: 09/14/2021 04:38 PM ? ? Modules accepted: Orders ? ?

## 2021-09-26 ENCOUNTER — Ambulatory Visit
Admission: RE | Admit: 2021-09-26 | Discharge: 2021-09-26 | Disposition: A | Discharge status: Home / Self Care | Payer: Medicare Other | Source: Ambulatory Visit | Attending: Family Medicine | Admitting: Family Medicine

## 2021-09-26 DIAGNOSIS — R918 Other nonspecific abnormal finding of lung field: Secondary | ICD-10-CM | POA: Diagnosis not present

## 2021-09-26 DIAGNOSIS — R911 Solitary pulmonary nodule: Secondary | ICD-10-CM | POA: Diagnosis not present

## 2021-09-26 DIAGNOSIS — Z87891 Personal history of nicotine dependence: Secondary | ICD-10-CM | POA: Insufficient documentation

## 2021-12-25 DIAGNOSIS — H40013 Open angle with borderline findings, low risk, bilateral: Secondary | ICD-10-CM | POA: Diagnosis not present

## 2022-02-15 ENCOUNTER — Telehealth: Payer: Self-pay

## 2022-02-15 DIAGNOSIS — R220 Localized swelling, mass and lump, head: Secondary | ICD-10-CM

## 2022-02-15 NOTE — Telephone Encounter (Unsigned)
Copied from [place] [id number]. Topic: Referral - Request for Referral >> Feb 14, 2022  4:20 PM [name] wrote: Has patient seen PCP for this complaint? No.  *If NO, is insurance requiring patient see PCP for this issue before PCP can refer them? Referral for which specialty: Dermatology  Preferred provider/office: Highest recommended  Reason for referral: Says he has knot on his head, says it was a spot on his head all his life but it just started growing within the last 4/5 months. Says he has never mentioned this to his PCP.

## 2022-02-19 NOTE — Telephone Encounter (Signed)
Referral submitted

## 2022-03-02 ENCOUNTER — Encounter: Payer: Self-pay | Admitting: Family Medicine

## 2022-03-02 ENCOUNTER — Ambulatory Visit (INDEPENDENT_AMBULATORY_CARE_PROVIDER_SITE_OTHER): Payer: Medicare Other | Admitting: Family Medicine

## 2022-03-02 VITALS — BP 139/86 | HR 68 | Ht 70.0 in | Wt 191.4 lb

## 2022-03-02 DIAGNOSIS — Z23 Encounter for immunization: Secondary | ICD-10-CM

## 2022-03-02 DIAGNOSIS — Z8546 Personal history of malignant neoplasm of prostate: Secondary | ICD-10-CM

## 2022-03-02 DIAGNOSIS — I10 Essential (primary) hypertension: Secondary | ICD-10-CM

## 2022-03-02 DIAGNOSIS — E559 Vitamin D deficiency, unspecified: Secondary | ICD-10-CM

## 2022-03-02 DIAGNOSIS — R7303 Prediabetes: Secondary | ICD-10-CM | POA: Diagnosis not present

## 2022-03-02 DIAGNOSIS — E7800 Pure hypercholesterolemia, unspecified: Secondary | ICD-10-CM

## 2022-03-02 DIAGNOSIS — Z0000 Encounter for general adult medical examination without abnormal findings: Secondary | ICD-10-CM | POA: Diagnosis not present

## 2022-03-02 DIAGNOSIS — D485 Neoplasm of uncertain behavior of skin: Secondary | ICD-10-CM

## 2022-03-02 NOTE — Patient Instructions (Addendum)
Thank you for coming to the office today.  Referral sent to Dermatologist - if you do not hear back within 2 weeks, you can call them to schedule.  [address]   [place], [place] [address] Hours: 8AM-5PM Phone: [phone]  Let me know if wait time is longer than 3 months, then we can consider a different location if you prefer  [facility] Dermatologist in [place], [place] Address: [address] [place], [place] [address] Phone: [phone]  --------------------------------  Flu Shot today  Labs stay tuned   Please schedule a Follow-up Appointment to: Return in about 6 months (around 08/31/2022) for 6 month follow-up PreDM A1c, HTN.  If you have any other questions or concerns, please feel free to call the office or send a message through [facility]. You may also schedule an earlier appointment if necessary.  Additionally, you may be receiving a survey about your experience at our office within a few days to 1 week by e-mail or mail. We value your feedback.  Nobie Putnam, DO [place]
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
## 2022-03-02 NOTE — Assessment & Plan Note (Signed)
Surveillance with PSA

## 2022-03-02 NOTE — Assessment & Plan Note (Signed)
Improved on Statin therapy on intermittent dosing still, tolerating well some mild myalgia The 10-year ASCVD risk score (Arnett DK, et al., 2019) is: 18.2%  Plan: 1. Continue on Rosuvastatin '10mg'$  EVERY OTHER DAY 2. Encourage improved lifestyle - low carb/cholesterol, reduce portion size, continue improving regular exercise

## 2022-03-02 NOTE — Assessment & Plan Note (Signed)
A1c to 6.3 on last lab Due for A1c  Plan:  1. Not on any therapy currently 2. Encourage improved lifestyle - low carb, low sugar diet, reduce portion size, continue improving regular exercise

## 2022-03-02 NOTE — Assessment & Plan Note (Signed)
Mild elevated but within range on BP No known complications    Plan:  1. Continue Amlodipine '10mg'$  daily HTN therapy 2. Encourage improved lifestyle - low sodium diet, regular exercise 3. Continue monitor BP outside office, bring readings to next visit, if persistently >140/90 or new symptoms notify office sooner  If needed consider 2nd agent Thiazide.

## 2022-03-03 LAB — CBC WITH DIFFERENTIAL/PLATELET
Absolute Monocytes: 565 cells/uL (ref 200–950)
Basophils Absolute: 40 cells/uL (ref 0–200)
Basophils Relative: 0.8 %
Eosinophils Absolute: 80 cells/uL (ref 15–500)
Eosinophils Relative: 1.6 %
HCT: 38.7 % (ref 38.5–50.0)
Hemoglobin: 13.2 g/dL (ref 13.2–17.1)
Lymphs Abs: 1945 cells/uL (ref 850–3900)
MCH: 30.4 pg (ref 27.0–33.0)
MCHC: 34.1 g/dL (ref 32.0–36.0)
MCV: 89.2 fL (ref 80.0–100.0)
MPV: 10.2 fL (ref 7.5–12.5)
Monocytes Relative: 11.3 %
Neutro Abs: 2370 cells/uL (ref 1500–7800)
Neutrophils Relative %: 47.4 %
Platelets: 333 10*3/uL (ref 140–400)
RBC: 4.34 10*6/uL (ref 4.20–5.80)
RDW: 14.2 % (ref 11.0–15.0)
Total Lymphocyte: 38.9 %
WBC: 5 10*3/uL (ref 3.8–10.8)

## 2022-03-03 LAB — COMPLETE METABOLIC PANEL WITH GFR
AG Ratio: 1.4 (calc) (ref 1.0–2.5)
ALT: 20 U/L (ref 9–46)
AST: 18 U/L (ref 10–35)
Albumin: 4.3 g/dL (ref 3.6–5.1)
Alkaline phosphatase (APISO): 38 U/L (ref 35–144)
BUN: 10 mg/dL (ref 7–25)
CO2: 26 mmol/L (ref 20–32)
Calcium: 9.4 mg/dL (ref 8.6–10.3)
Chloride: 103 mmol/L (ref 98–110)
Creat: 1.01 mg/dL (ref 0.70–1.35)
Globulin: 3 g/dL (calc) (ref 1.9–3.7)
Glucose, Bld: 113 mg/dL — ABNORMAL HIGH (ref 65–99)
Potassium: 4.1 mmol/L (ref 3.5–5.3)
Sodium: 136 mmol/L (ref 135–146)
Total Bilirubin: 0.7 mg/dL (ref 0.2–1.2)
Total Protein: 7.3 g/dL (ref 6.1–8.1)
eGFR: 82 mL/min/{1.73_m2} (ref >=60)

## 2022-03-03 LAB — LIPID PANEL
Cholesterol: 157 mg/dL (ref <200)
HDL: 48 mg/dL (ref >=40)
LDL Cholesterol (Calc): 95 mg/dL (calc)
Non-HDL Cholesterol (Calc): 109 mg/dL (calc) (ref <130)
Total CHOL/HDL Ratio: 3.3 (calc) (ref <5.0)
Triglycerides: 62 mg/dL (ref <150)

## 2022-03-03 LAB — HEMOGLOBIN A1C
Hgb A1c MFr Bld: 6.3 % of total Hgb — ABNORMAL HIGH (ref <5.7)
Mean Plasma Glucose: 134 mg/dL
eAG (mmol/L): 7.4 mmol/L

## 2022-03-03 LAB — PSA: PSA: 0.18 ng/mL (ref <=4.00)

## 2022-03-03 LAB — VITAMIN D 25 HYDROXY (VIT D DEFICIENCY, FRACTURES): Vit D, 25-Hydroxy: 27 ng/mL — ABNORMAL LOW (ref 30–100)

## 2022-03-27 DIAGNOSIS — D485 Neoplasm of uncertain behavior of skin: Secondary | ICD-10-CM | POA: Diagnosis not present

## 2022-03-27 DIAGNOSIS — D234 Other benign neoplasm of skin of scalp and neck: Secondary | ICD-10-CM | POA: Diagnosis not present

## 2022-03-27 DIAGNOSIS — C4441 Basal cell carcinoma of skin of scalp and neck: Secondary | ICD-10-CM | POA: Diagnosis not present

## 2022-08-08 ENCOUNTER — Other Ambulatory Visit: Payer: Self-pay | Admitting: Family Medicine

## 2022-08-08 DIAGNOSIS — I10 Essential (primary) hypertension: Secondary | ICD-10-CM

## 2022-08-09 NOTE — Telephone Encounter (Signed)
Requested Prescriptions  Pending Prescriptions Disp Refills   amLODipine (NORVASC) 10 MG tablet [Pharmacy Med Name: AMLODIPINE BESYLATE 10 MG TAB] 90 tablet 0    Sig: TAKE 1 TABLET BY MOUTH EVERY DAY     Cardiovascular: Calcium Channel Blockers 2 Passed - 08/08/2022  1:13 PM      Passed - Last BP in normal range    BP Readings from Last 1 Encounters:  03/02/22 139/86         Passed - Last Heart Rate in normal range    Pulse Readings from Last 1 Encounters:  03/02/22 68         Passed - Valid encounter within last 6 months    Recent Outpatient Visits           5 months ago Annual physical exam   [facility] Olin Hauser, DO   11 months ago Essential hypertension   [facility] Olin Hauser, [place]   11 months ago Left groin pain   [place], [place]   1 year ago Annual physical exam   [facility] Olin Hauser, DO   1 year ago Essential hypertension   [place], DO       Future Appointments             In 3 weeks [place], Devonne Doughty, DO [facility], [place]   In 2 months Ralene Bathe, MD [place]

## 2022-08-16 ENCOUNTER — Telehealth: Payer: Self-pay | Admitting: Family Medicine

## 2022-08-16 NOTE — Telephone Encounter (Signed)
Called patient to schedule Medicare Annual Wellness Visit (AWV). Left message for patient to call back and schedule Medicare Annual Wellness Visit (AWV).  Last date of AWV: 09/05/22  Please schedule an appointment at any time with Kirke Shaggy, NHA  .  If any questions, please contact me.  Thank you ,  Sherol Dade; [place] Direct Dial: [phone]

## 2022-08-31 ENCOUNTER — Encounter: Payer: Self-pay | Admitting: Family Medicine

## 2022-08-31 ENCOUNTER — Ambulatory Visit (INDEPENDENT_AMBULATORY_CARE_PROVIDER_SITE_OTHER): Payer: Medicare Other | Admitting: Family Medicine

## 2022-08-31 VITALS — BP 120/74 | HR 65 | Ht 70.0 in | Wt 197.6 lb

## 2022-08-31 DIAGNOSIS — I10 Essential (primary) hypertension: Secondary | ICD-10-CM | POA: Diagnosis not present

## 2022-08-31 DIAGNOSIS — N529 Male erectile dysfunction, unspecified: Secondary | ICD-10-CM

## 2022-08-31 DIAGNOSIS — E7800 Pure hypercholesterolemia, unspecified: Secondary | ICD-10-CM

## 2022-08-31 DIAGNOSIS — R7303 Prediabetes: Secondary | ICD-10-CM

## 2022-08-31 LAB — POCT GLYCOSYLATED HEMOGLOBIN (HGB A1C): Hemoglobin A1C: 6.6 % — AB (ref 4.0–5.6)

## 2022-08-31 MED ORDER — ROSUVASTATIN CALCIUM 10 MG PO TABS: 10.0000 mg | ORAL_TABLET | ORAL | 3 refills | Status: DC

## 2022-08-31 MED ORDER — AMLODIPINE BESYLATE 10 MG PO TABS: 10.0000 mg | ORAL_TABLET | Freq: Every day | ORAL | 3 refills | Status: DC

## 2022-08-31 MED ORDER — SILDENAFIL CITRATE 20 MG PO TABS: ORAL_TABLET | ORAL | 5 refills | Status: DC

## 2022-08-31 NOTE — Assessment & Plan Note (Signed)
Controlled BP No known complications    Plan:  1. Continue Amlodipine 10mg  daily HTN therapy 2. Encourage improved lifestyle - low sodium diet, regular exercise 3. Continue monitor BP outside office, bring readings to next visit, if persistently >140/90 or new symptoms notify office sooner  If needed consider 2nd agent Thiazide.

## 2022-08-31 NOTE — Progress Notes (Signed)
Subjective:    Patient ID: Justin Mckee, male    DOB: Nov 13, 1955, 67 y.o.   MRN: [id number]  Justin Mckee is a 67 y.o. male presenting on 08/31/2022 for Prediabetes   HPI  Chronic Hypertension Checking BP at home avg 130/80s, doing well, no concerns. Meds - Amlodipine 10mg  daily Denies Headaches, CP, dyspnea, edema, dizziness / lightheadedness, numbness weakness, hearing loss   HYPERLIPIDEMIA: - Reports no concerns. Last lipid panel 02/2022, improved on Statin however had symptoms was switched to intermittent - Currently taking Rosuvastatin 10mg  every OTHER day or up to every 3rd day now, tolerating well with only mild myalgia ache   Pre-Diabetes Prior A1c 6.1 to 6.3 range Due today for A1c Meds: never on meds Lifestyle Weight up - Diet admits had been drinking more sodas again and sugar in coffee - Exercise physically active at work and doing housework Denies hypoglycemia, polyuria   Admits some Left sided back/flank aching Taking occasional muscle relaxant with relief Usually short term symptoms.    Health Maintenance:   Screening Colon Cancer:  had Colonoscopy Dr Allen Norris ([facility]), 04/10/21 no polyps or abnormality, recommend repeat 10 years, 2032   Updated Flu shot and RSV Vaccine from 2023   Completed Shingrix already, updated.  Completed Pneumonia prevnar 20      08/31/2022    8:11 AM 09/04/2021    4:42 PM 08/22/2021    9:42 AM  Depression screen PHQ 2/9  Decreased Interest 0 0 0  Down, Depressed, Hopeless 0 0 0  PHQ - 2 Score 0 0 0  Altered sleeping 1 0 1  Tired, decreased energy 0 0 0  Change in appetite 1 0 0  Feeling bad or failure about yourself  0 0 0  Trouble concentrating 0 0 0  Moving slowly or fidgety/restless 0 0 0  Suicidal thoughts 0 0 0  PHQ-9 Score 2 0 1  Difficult doing work/chores Not difficult at all  Not difficult at all    Social History   Tobacco Use   Smoking status: Former    Packs/day: 1.00    Years: 40.00    Additional pack years:  0.00    Total pack years: 40.00    Types: Cigarettes    Quit date: 06/01/2016    Years since quitting: 6.2   Smokeless tobacco: Former    Quit date: 08/28/2015  Vaping Use   Vaping Use: Never used  Substance Use Topics   Alcohol use: Yes    Alcohol/week: 12.0 standard drinks of alcohol    Types: 12 Cans of beer per week    Comment: weekly   Drug use: No    Review of Systems Per HPI unless specifically indicated above     Objective:    BP 120/74   Pulse 65   Ht 5\' 10"  (1.778 m)   Wt 197 lb 9.6 oz (89.6 kg)   SpO2 96%   BMI 28.35 kg/m   Wt Readings from Last 3 Encounters:  08/31/22 197 lb 9.6 oz (89.6 kg)  03/02/22 191 lb 6.4 oz (86.8 kg)  08/29/21 191 lb 6.4 oz (86.8 kg)    Physical Exam Vitals and nursing note reviewed.  Constitutional:      General: He is not in acute distress.    Appearance: He is well-developed. He is not diaphoretic.     Comments: Well-appearing, comfortable, cooperative  HENT:     Head: Normocephalic and atraumatic.  Eyes:     General:  Right eye: No discharge.        Left eye: No discharge.     Conjunctiva/sclera: Conjunctivae normal.  Neck:     Thyroid: No thyromegaly.  Cardiovascular:     Rate and Rhythm: Normal rate and regular rhythm.     Pulses: Normal pulses.     Heart sounds: Normal heart sounds. No murmur heard. Pulmonary:     Effort: Pulmonary effort is normal. No respiratory distress.     Breath sounds: Normal breath sounds. No wheezing or rales.  Musculoskeletal:        General: Normal range of motion.     Cervical back: Normal range of motion and neck supple.  Lymphadenopathy:     Cervical: No cervical adenopathy.  Skin:    General: Skin is warm and dry.     Findings: No erythema or rash.  Neurological:     Mental Status: He is alert and oriented to person, place, and time. Mental status is at baseline.  Psychiatric:        Behavior: Behavior normal.     Comments: Well groomed, good eye contact, normal speech  and thoughts     Recent Labs    03/02/22 0909 08/31/22 0818  HGBA1C 6.3* 6.6*    Results for orders placed or performed in visit on 08/31/22  POCT HgB A1C  Result Value Ref Range   Hemoglobin A1C 6.6 (A) 4.0 - 5.6 %      Assessment & Plan:   Problem List Items Addressed This Visit     ED (erectile dysfunction)   Relevant Medications   sildenafil (REVATIO) 20 MG tablet   Essential hypertension    Controlled BP No known complications    Plan:  1. Continue Amlodipine 10mg  daily HTN therapy 2. Encourage improved lifestyle - low sodium diet, regular exercise 3. Continue monitor BP outside office, bring readings to next visit, if persistently >140/90 or new symptoms notify office sooner  If needed consider 2nd agent Thiazide.      Relevant Medications   amLODipine (NORVASC) 10 MG tablet   rosuvastatin (CRESTOR) 10 MG tablet   sildenafil (REVATIO) 20 MG tablet   Pre-diabetes - Primary    A1c up to 6.6, similar to prior range 6.3 to 6.6 Discussion on if would consider Type 2 Diabetic at this point, he admits significant inc in sodas, and confident he can reverse this naturally without medication Defer Metformin start today, he will try to resolve A1c back to PreDM in 3 months and if not we can pursue DM therapy  Encourage improved lifestyle - low carb, low sugar diet, reduce portion size, continue improving regular exercise      Relevant Orders   POCT HgB A1C (Completed)   Pure hypercholesterolemia    Improved on Statin therapy on intermittent dosing still, tolerating well some mild myalgia The 10-year ASCVD risk score (Arnett DK, et al., 2019) is: 14.1%  Plan: 1. Continue on Rosuvastatin 10mg  EVERY OTHER DAY 2. Encourage improved lifestyle - low carb/cholesterol, reduce portion size, continue improving regular exercise      Relevant Medications   amLODipine (NORVASC) 10 MG tablet   rosuvastatin (CRESTOR) 10 MG tablet   sildenafil (REVATIO) 20 MG tablet     Meds ordered this encounter  Medications   amLODipine (NORVASC) 10 MG tablet    Sig: Take 1 tablet (10 mg total) by mouth daily.    Dispense:  90 tablet    Refill:  3    Add refills  for future   rosuvastatin (CRESTOR) 10 MG tablet    Sig: Take 1 tablet (10 mg total) by mouth every other day.    Dispense:  45 tablet    Refill:  3    Add future refills   sildenafil (REVATIO) 20 MG tablet    Sig: Take 3-4 pills about 30 min prior to sex.    Dispense:  30 tablet    Refill:  5     Follow up plan: Return in about 3 months (around 12/01/2022) for 3 month PreDM A1c HTN.  Nobie Putnam, [facility] 08/31/2022, 8:11 AM

## 2022-08-31 NOTE — Assessment & Plan Note (Signed)
A1c up to 6.6, similar to prior range 6.3 to 6.6 Discussion on if would consider Type 2 Diabetic at this point, he admits significant inc in sodas, and confident he can reverse this naturally without medication Defer Metformin start today, he will try to resolve A1c back to PreDM in 3 months and if not we can pursue DM therapy  Encourage improved lifestyle - low carb, low sugar diet, reduce portion size, continue improving regular exercise

## 2022-08-31 NOTE — Assessment & Plan Note (Signed)
Improved on Statin therapy on intermittent dosing still, tolerating well some mild myalgia The 10-year ASCVD risk score (Arnett DK, et al., 2019) is: 14.1%  Plan: 1. Continue on Rosuvastatin 10mg  EVERY OTHER DAY 2. Encourage improved lifestyle - low carb/cholesterol, reduce portion size, continue improving regular exercise

## 2022-08-31 NOTE — Patient Instructions (Addendum)
Thank you for coming to the office today.  Try to reduce sodas and limit sugar in coffee  Reduce carb starches sugars in diet overall  Recent Labs    03/02/22 0909 08/31/22 0818  HGBA1C 6.3* 6.6*    A1c was up to 6.6, this is at risk of type 2 diabetes  We can reconsider medication at next visit if still elevated  Recommend COVID Booster in the Fall 2024  Refilled medication   Please schedule a Follow-up Appointment to: Return in about 3 months (around 12/01/2022) for 3 month PreDM A1c HTN.  If you have any other questions or concerns, please feel free to call the office or send a message through [place]. You may also schedule an earlier appointment if necessary.  Additionally, you may be receiving a survey about your experience at our office within a few days to 1 week by e-mail or mail. We value your feedback.  Nobie Putnam, DO [place]

## 2022-09-22 IMAGING — CT CT CHEST NODULE FOLLOW UP LOW DOSE W/O CM
2 of 4 series · 15 of 36 positions shown, 18 images · non-contrast
Comparison: Chest CT dated July 25, 2020

CLINICAL DATA: Lung nodules



[Series 3: lung thins · axial · 0.79mm/px · z∈[-732,-429]mm · 12 of 335 slices shown, 15 images]
[im 16/335  mediastinal]
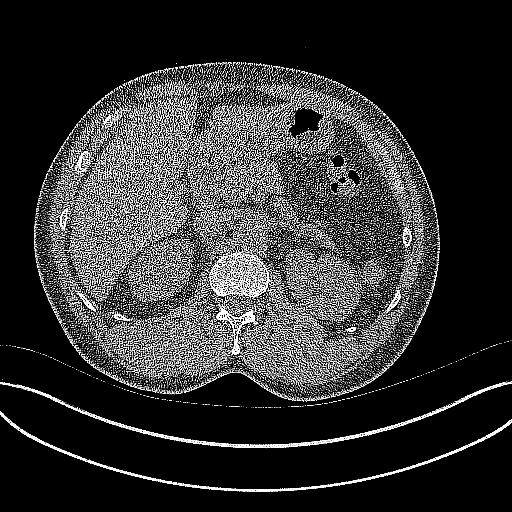
[im 16/335  lung]
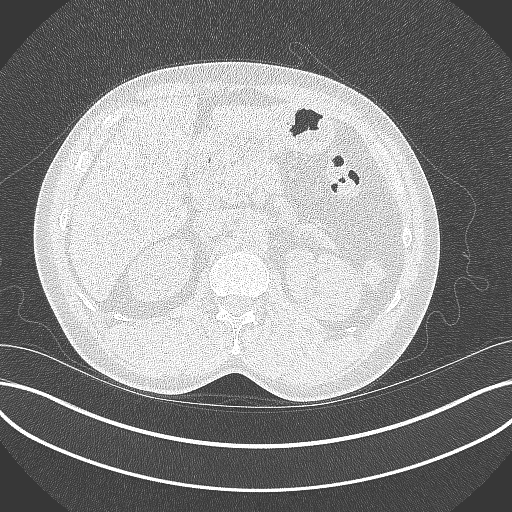
[im 46/335  lung]
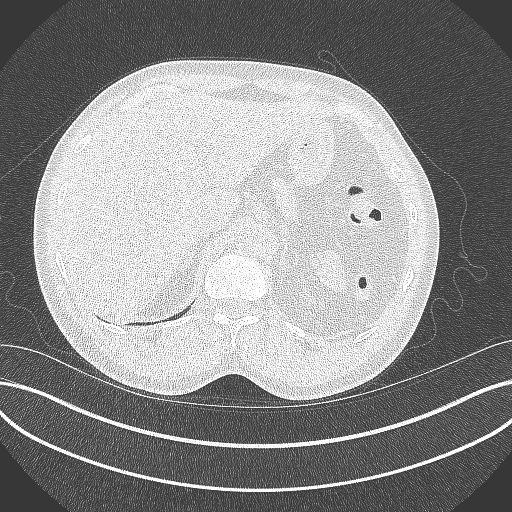
[im 76/335  lung]
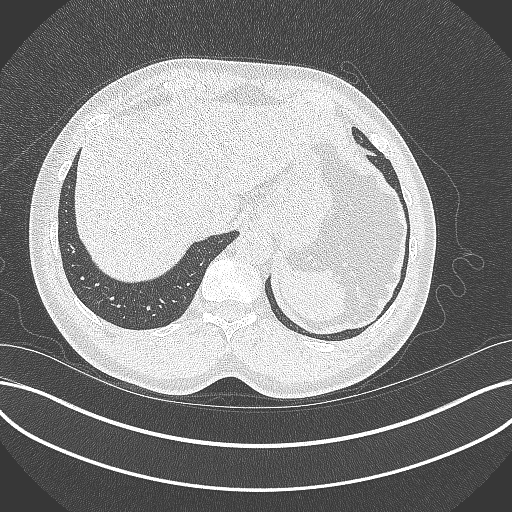
[im 107/335  lung]
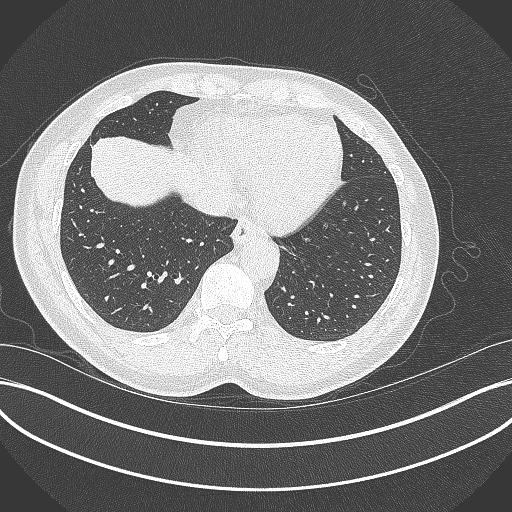
[im 122/335  mediastinal]
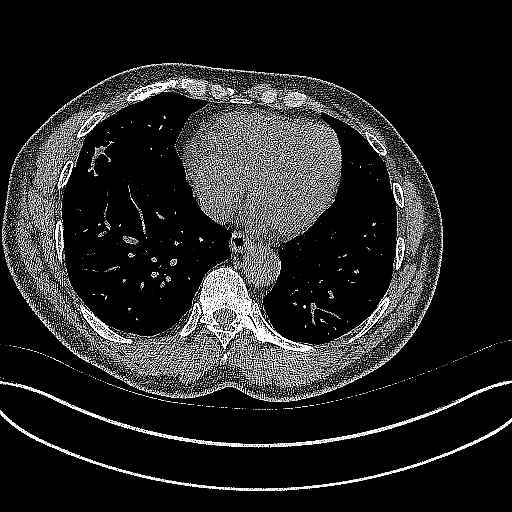
[im 122/335  lung]
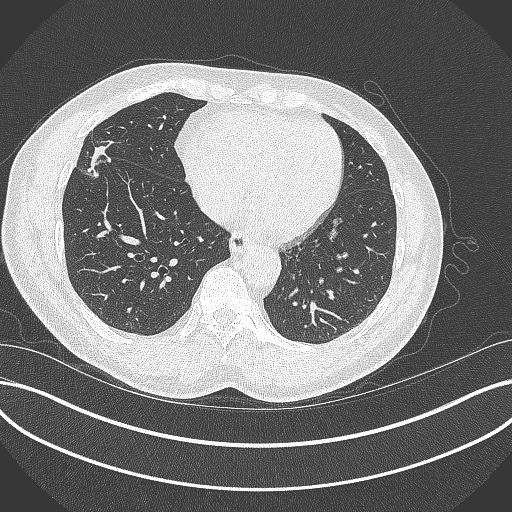
[im 152/335  lung]
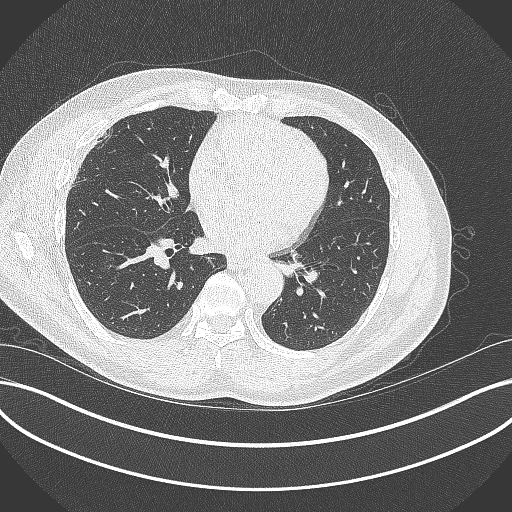
[im 183/335  lung]
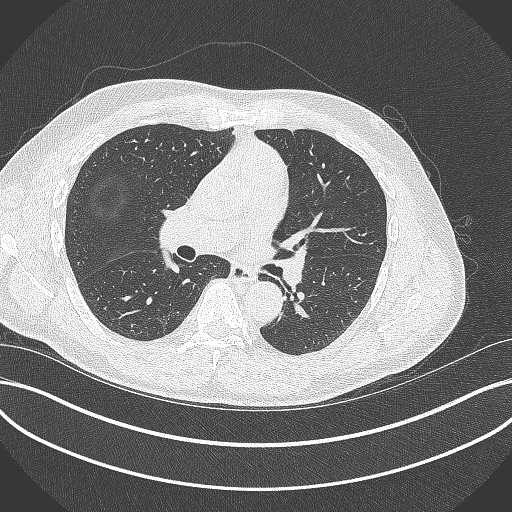
[im 213/335  lung]
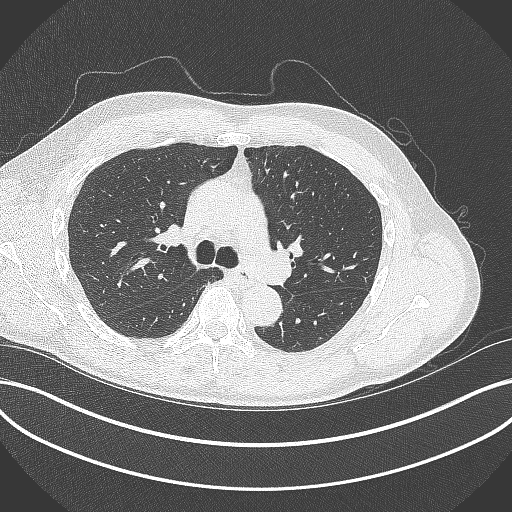
[im 228/335  mediastinal]
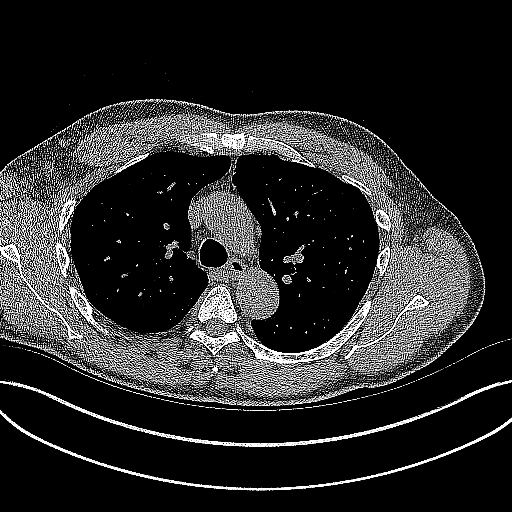
[im 228/335  lung]
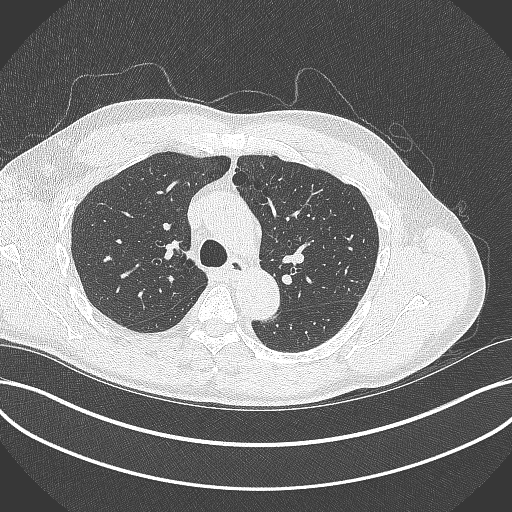
[im 259/335  lung]
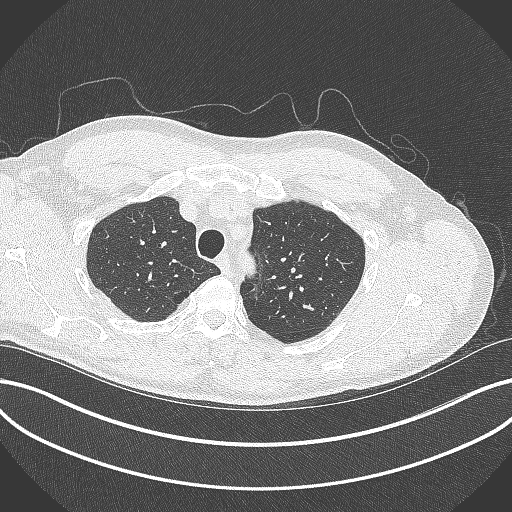
[im 289/335  lung]
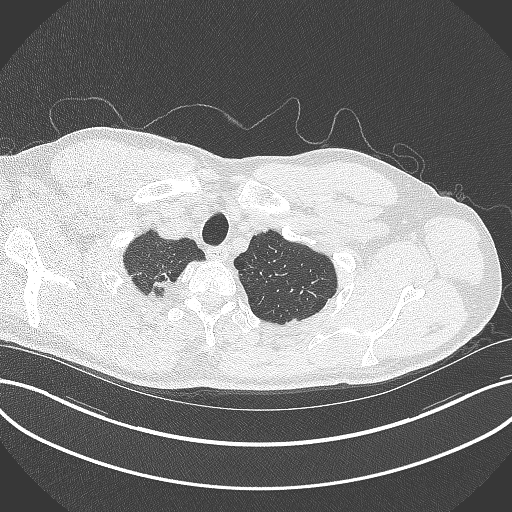
[im 319/335  lung]
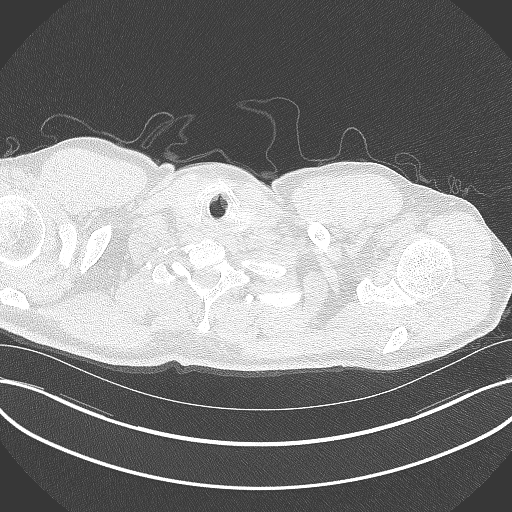

[Series 5: coronal · coronal · 0.68mm/px · 3 of 276 slices shown]
[im 56/276  lung]
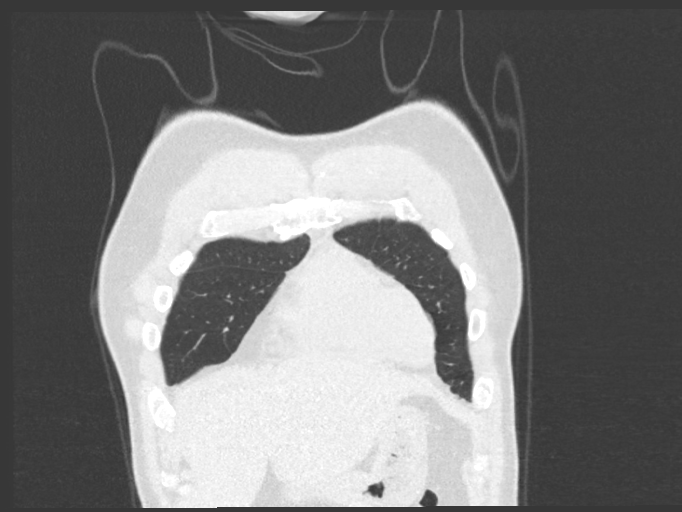
[im 111/276  lung]
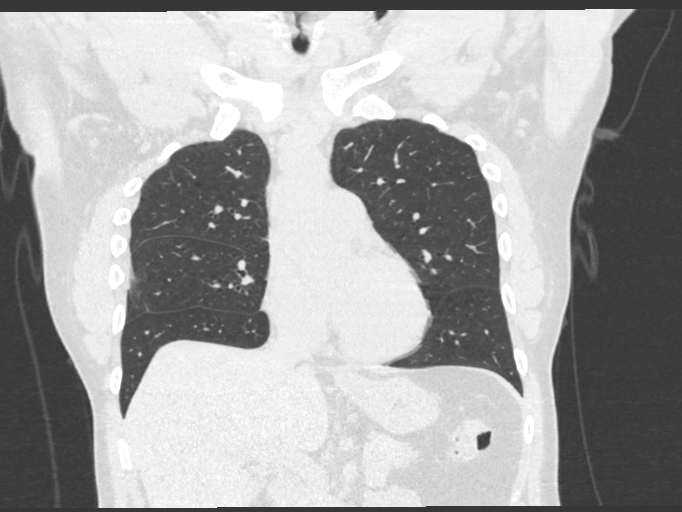
[im 166/276  lung]
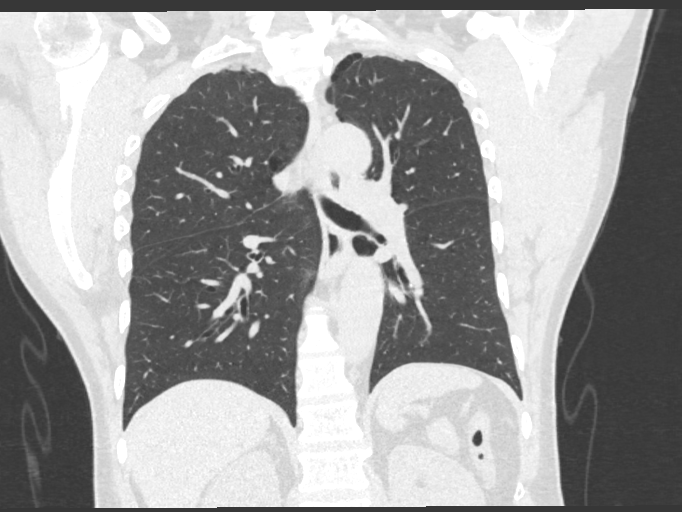

[15 of 36 positions shown; findings below may reference images not displayed]

FINDINGS: Cardiovascular: Normal heart size. No pericardial effusion. Mild
coronary artery calcifications. No significant atherosclerotic
disease minimal calcified plaque of the thoracic aorta.

Mediastinum/Nodes: Esophagus and thyroid are unremarkable. No
pathologically enlarged lymph nodes seen in the chest.

Lungs/Pleura: Central airways are patent. Paraseptal emphysema.
Linear consolidation of the right middle lobe is unchanged when
compared with prior exams and likely due to scarring. Stable right
upper lobe pulmonary nodule measuring 4 mm on series 3, image 164.
Stable right middle lobe pulmonary nodule measuring 4 mm on series
3, image 26.

Upper Abdomen: No acute abnormality.

Musculoskeletal: No chest wall mass or suspicious bone lesions
identified.
IMPRESSION: 1. Stable small solid pulmonary nodules of the right upper right
middle lobe, favored to be benign given greater than 1 year
stability.
2. Aortic Atherosclerosis (CP6EU-95K.K) and Emphysema (CP6EU-Q88.5).

## 2022-10-25 ENCOUNTER — Ambulatory Visit: Payer: Medicare Other | Admitting: Dermatology

## 2022-10-25 VITALS — BP 127/86 | HR 65

## 2022-10-25 DIAGNOSIS — C4441 Basal cell carcinoma of skin of scalp and neck: Secondary | ICD-10-CM

## 2022-10-25 DIAGNOSIS — L821 Other seborrheic keratosis: Secondary | ICD-10-CM | POA: Diagnosis not present

## 2022-10-25 DIAGNOSIS — Z7189 Other specified counseling: Secondary | ICD-10-CM

## 2022-10-25 DIAGNOSIS — D492 Neoplasm of unspecified behavior of bone, soft tissue, and skin: Secondary | ICD-10-CM

## 2022-10-25 NOTE — Patient Instructions (Addendum)
Wound Care Instructions  Cleanse wound gently with soap and water once a day then pat dry with clean gauze. Apply a thin coat of Petrolatum (petroleum jelly, "Vaseline") over the wound (unless you have an allergy to this). We recommend that you use a new, sterile tube of Vaseline. Do not pick or remove scabs. Do not remove the yellow or white "healing tissue" from the base of the wound.  Cover the wound with fresh, clean, nonstick gauze and secure with paper tape. You may use Band-Aids in place of gauze and tape if the wound is small enough, but would recommend trimming much of the tape off as there is often too much. Sometimes Band-Aids can irritate the skin.  You should call the office for your biopsy report after 1 week if you have not already been contacted.  If you experience any problems, such as abnormal amounts of bleeding, swelling, significant bruising, significant pain, or evidence of infection, please call the office immediately.  FOR ADULT SURGERY PATIENTS: If you need something for pain relief you may take 1 extra strength Tylenol (acetaminophen) AND 2 Ibuprofen (200mg each) together every 4 hours as needed for pain. (do not take these if you are allergic to them or if you have a reason you should not take them.) Typically, you may only need pain medication for 1 to 3 days.        Due to recent changes in healthcare laws, you may see results of your pathology and/or laboratory studies on MyChart before the doctors have had a chance to review them. We understand that in some cases there may be results that are confusing or concerning to you. Please understand that not all results are received at the same time and often the doctors may need to interpret multiple results in order to provide you with the best plan of care or course of treatment. Therefore, we ask that you please give us 2 business days to thoroughly review all your results before contacting the office for clarification.  Should we see a critical lab result, you will be contacted sooner.   If You Need Anything After Your Visit  If you have any questions or concerns for your doctor, please call our main line at [phone] and press option 4 to reach your doctor's medical assistant. If no one answers, please leave a voicemail as directed and we will return your call as soon as possible. Messages left after 4 pm will be answered the following business day.   You may also send us a message via MyChart. We typically respond to MyChart messages within 1-2 business days.  For prescription refills, please ask your pharmacy to contact our office. Our fax number is [phone].  If you have an urgent issue when the clinic is closed that cannot wait until the next business day, you can page your doctor at the number below.    Please note that while we do our best to be available for urgent issues outside of office hours, we are not available 24/7.   If you have an urgent issue and are unable to reach us, you may choose to seek medical care at your doctor's office, retail clinic, urgent care center, or emergency room.  If you have a medical emergency, please immediately call 911 or go to the emergency department.  Pager Numbers  - Dr. Kowalski: [phone]  - Dr. Moye: [phone]  - Dr. Stewart: [phone]  In the event of inclement weather, please call our   main line at [phone] for an update on the status of any delays or closures.  Dermatology Medication Tips: Please keep the boxes that topical medications come in in order to help keep track of the instructions about where and how to use these. Pharmacies typically print the medication instructions only on the boxes and not directly on the medication tubes.   If your medication is too expensive, please contact our office at [phone] option 4 or send us a message through MyChart.   We are unable to tell what your co-pay for medications will be  in advance as this is different depending on your insurance coverage. However, we may be able to find a substitute medication at lower cost or fill out paperwork to get insurance to cover a needed medication.   If a prior authorization is required to get your medication covered by your insurance company, please allow us 1-2 business days to complete this process.  Drug prices often vary depending on where the prescription is filled and some pharmacies may offer cheaper prices.  The website www.goodrx.com contains coupons for medications through different pharmacies. The prices here do not account for what the cost may be with help from insurance (it may be cheaper with your insurance), but the website can give you the price if you did not use any insurance.  - You can print the associated coupon and take it with your prescription to the pharmacy.  - You may also stop by our office during regular business hours and pick up a GoodRx coupon card.  - If you need your prescription sent electronically to a different pharmacy, notify our office through [place] MyChart or by phone at [phone] option 4.     Si Usted Necesita Algo Despus de Su Visita  Tambin puede enviarnos un mensaje a travs de MyChart. Por lo general respondemos a los mensajes de MyChart en el transcurso de 1 a 2 das hbiles.  Para renovar recetas, por favor pida a su farmacia que se ponga en contacto con nuestra oficina. Nuestro nmero de fax es el [phone].  Si tiene un asunto urgente cuando la clnica est cerrada y que no puede esperar hasta el siguiente da hbil, puede llamar/localizar a su doctor(a) al nmero que aparece a continuacin.   Por favor, tenga en cuenta que aunque hacemos todo lo posible para estar disponibles para asuntos urgentes fuera del horario de oficina, no estamos disponibles las 24 horas del da, los 7 das de la semana.   Si tiene un problema urgente y no puede comunicarse con nosotros,  puede optar por buscar atencin mdica  en el consultorio de su doctor(a), en una clnica privada, en un centro de atencin urgente o en una sala de emergencias.  Si tiene una emergencia mdica, por favor llame inmediatamente al 911 o vaya a la sala de emergencias.  Nmeros de bper  - Dr. Kowalski: [phone]  - Dra. Moye: [phone]  - Dra. Stewart: [phone]  En caso de inclemencias del tiempo, por favor llame a nuestra lnea principal al [phone] para una actualizacin sobre el estado de cualquier retraso o cierre.  Consejos para la medicacin en dermatologa: Por favor, guarde las cajas en las que vienen los medicamentos de uso tpico para ayudarle a seguir las instrucciones sobre dnde y cmo usarlos. Las farmacias generalmente imprimen las instrucciones del medicamento slo en las cajas y no directamente en los tubos del medicamento.   Si su medicamento es muy caro, por favor, pngase   en contacto con nuestra oficina llamando al [phone] y presione la opcin 4 o envenos un mensaje a travs de MyChart.   No podemos decirle cul ser su copago por los medicamentos por adelantado ya que esto es diferente dependiendo de la cobertura de su seguro. Sin embargo, es posible que podamos encontrar un medicamento sustituto a menor costo o llenar un formulario para que el seguro cubra el medicamento que se considera necesario.   Si se requiere una autorizacin previa para que su compaa de seguros cubra su medicamento, por favor permtanos de 1 a 2 das hbiles para completar este proceso.  Los precios de los medicamentos varan con frecuencia dependiendo del lugar de dnde se surte la receta y alguna farmacias pueden ofrecer precios ms baratos.  El sitio web www.goodrx.com tiene cupones para medicamentos de diferentes farmacias. Los precios aqu no tienen en cuenta lo que podra costar con la ayuda del seguro (puede ser ms barato con su seguro), pero el sitio web puede darle el  precio si no utiliz ningn seguro.  - Puede imprimir el cupn correspondiente y llevarlo con su receta a la farmacia.  - Tambin puede pasar por nuestra oficina durante el horario de atencin regular y recoger una tarjeta de cupones de GoodRx.  - Si necesita que su receta se enve electrnicamente a una farmacia diferente, informe a nuestra oficina a travs de MyChart de [place] o por telfono llamando al [phone] y presione la opcin 4.     Due to recent changes in healthcare laws, you may see results of your pathology and/or laboratory studies on MyChart before the doctors have had a chance to review them. We understand that in some cases there may be results that are confusing or concerning to you. Please understand that not all results are received at the same time and often the doctors may need to interpret multiple results in order to provide you with the best plan of care or course of treatment. Therefore, we ask that you please give us 2 business days to thoroughly review all your results before contacting the office for clarification. Should we see a critical lab result, you will be contacted sooner.   If You Need Anything After Your Visit  If you have any questions or concerns for your doctor, please call our main line at [phone] and press option 4 to reach your doctor's medical assistant. If no one answers, please leave a voicemail as directed and we will return your call as soon as possible. Messages left after 4 pm will be answered the following business day.   You may also send us a message via MyChart. We typically respond to MyChart messages within 1-2 business days.  For prescription refills, please ask your pharmacy to contact our office. Our fax number is [phone].  If you have an urgent issue when the clinic is closed that cannot wait until the next business day, you can page your doctor at the number below.    Please note that while we do our best to be  available for urgent issues outside of office hours, we are not available 24/7.   If you have an urgent issue and are unable to reach us, you may choose to seek medical care at your doctor's office, retail clinic, urgent care center, or emergency room.  If you have a medical emergency, please immediately call 911 or go to the emergency department.  Pager Numbers  - Dr. Kowalski: [phone]  - Dr. Moye:   [phone]  - Dr. Stewart: [phone]  In the event of inclement weather, please call our main line at [phone] for an update on the status of any delays or closures.  Dermatology Medication Tips: Please keep the boxes that topical medications come in in order to help keep track of the instructions about where and how to use these. Pharmacies typically print the medication instructions only on the boxes and not directly on the medication tubes.   If your medication is too expensive, please contact our office at [phone] option 4 or send us a message through MyChart.   We are unable to tell what your co-pay for medications will be in advance as this is different depending on your insurance coverage. However, we may be able to find a substitute medication at lower cost or fill out paperwork to get insurance to cover a needed medication.   If a prior authorization is required to get your medication covered by your insurance company, please allow us 1-2 business days to complete this process.  Drug prices often vary depending on where the prescription is filled and some pharmacies may offer cheaper prices.  The website www.goodrx.com contains coupons for medications through different pharmacies. The prices here do not account for what the cost may be with help from insurance (it may be cheaper with your insurance), but the website can give you the price if you did not use any insurance.  - You can print the associated coupon and take it with your prescription to the pharmacy.  -  You may also stop by our office during regular business hours and pick up a GoodRx coupon card.  - If you need your prescription sent electronically to a different pharmacy, notify our office through [place] MyChart or by phone at [phone] option 4.     Si Usted Necesita Algo Despus de Su Visita  Tambin puede enviarnos un mensaje a travs de MyChart. Por lo general respondemos a los mensajes de MyChart en el transcurso de 1 a 2 das hbiles.  Para renovar recetas, por favor pida a su farmacia que se ponga en contacto con nuestra oficina. Nuestro nmero de fax es el [phone].  Si tiene un asunto urgente cuando la clnica est cerrada y que no puede esperar hasta el siguiente da hbil, puede llamar/localizar a su doctor(a) al nmero que aparece a continuacin.   Por favor, tenga en cuenta que aunque hacemos todo lo posible para estar disponibles para asuntos urgentes fuera del horario de oficina, no estamos disponibles las 24 horas del da, los 7 das de la semana.   Si tiene un problema urgente y no puede comunicarse con nosotros, puede optar por buscar atencin mdica  en el consultorio de su doctor(a), en una clnica privada, en un centro de atencin urgente o en una sala de emergencias.  Si tiene una emergencia mdica, por favor llame inmediatamente al 911 o vaya a la sala de emergencias.  Nmeros de bper  - Dr. Kowalski: [phone]  - Dra. Moye: [phone]  - Dra. Stewart: [phone]  En caso de inclemencias del tiempo, por favor llame a nuestra lnea principal al [phone] para una actualizacin sobre el estado de cualquier retraso o cierre.  Consejos para la medicacin en dermatologa: Por favor, guarde las cajas en las que vienen los medicamentos de uso tpico para ayudarle a seguir las instrucciones sobre dnde y cmo usarlos. Las farmacias generalmente imprimen las instrucciones del medicamento slo en las cajas y no directamente en   los tubos del  medicamento.   Si su medicamento es muy caro, por favor, pngase en contacto con nuestra oficina llamando al [phone] y presione la opcin 4 o envenos un mensaje a travs de MyChart.   No podemos decirle cul ser su copago por los medicamentos por adelantado ya que esto es diferente dependiendo de la cobertura de su seguro. Sin embargo, es posible que podamos encontrar un medicamento sustituto a menor costo o llenar un formulario para que el seguro cubra el medicamento que se considera necesario.   Si se requiere una autorizacin previa para que su compaa de seguros cubra su medicamento, por favor permtanos de 1 a 2 das hbiles para completar este proceso.  Los precios de los medicamentos varan con frecuencia dependiendo del lugar de dnde se surte la receta y alguna farmacias pueden ofrecer precios ms baratos.  El sitio web www.goodrx.com tiene cupones para medicamentos de diferentes farmacias. Los precios aqu no tienen en cuenta lo que podra costar con la ayuda del seguro (puede ser ms barato con su seguro), pero el sitio web puede darle el precio si no utiliz ningn seguro.  - Puede imprimir el cupn correspondiente y llevarlo con su receta a la farmacia.  - Tambin puede pasar por nuestra oficina durante el horario de atencin regular y recoger una tarjeta de cupones de GoodRx.  - Si necesita que su receta se enve electrnicamente a una farmacia diferente, informe a nuestra oficina a travs de MyChart de [place] o por telfono llamando al [phone] y presione la opcin 4.  

## 2022-10-25 NOTE — Progress Notes (Signed)
   New Patient Visit   Subjective  Justin Mckee is a 67 y.o. male who presents for the following: The patient has spots on the face and scalp to be evaluated, some may be new or changing and the patient may have concern these could be cancer.    The following portions of the chart were reviewed this encounter and updated as appropriate: medications, allergies, medical history  Review of Systems:  No other skin or systemic complaints except as noted in HPI or Assessment and Plan.  Objective  Well appearing patient in no apparent distress; mood and affect are within normal limits.  A focused examination was performed of the following areas: face,scalp    Relevant exam findings are noted in the Assessment and Plan.  right scalp of the left ear 4.5 x 1.5 cm brown papule       Assessment & Plan   SEBORRHEIC KERATOSIS - Stuck-on, waxy, tan-brown papules and/or plaques  - Benign-appearing - Discussed benign etiology and prognosis. - Observe - Call for any changes   Neoplasm of skin right scalp of the left ear  Skin / nail biopsy Type of biopsy: tangential   Informed consent: discussed and consent obtained   Patient was prepped and draped in usual sterile fashion: area prepped with alochol. Anesthesia: the lesion was anesthetized in a standard fashion   Anesthetic:  1% lidocaine w/ epinephrine 1-100,000 buffered w/ 8.4% NaHCO3 Instrument used: flexible razor blade   Hemostasis achieved with: pressure, aluminum chloride and electrodesiccation   Outcome: patient tolerated procedure well   Post-procedure details: wound care instructions given   Post-procedure details comment:  Ointment and small bandage  Specimen 1 - Surgical pathology Differential Diagnosis: R/O linear epidermal nevus vs Nevus Sebaceous vs other   Check Margins: No   Return if symptoms worsen or fail to improve.  IAngelique Holm, CMA, am acting as scribe for Armida Sans, MD .   Documentation: I  have reviewed the above documentation for accuracy and completeness, and I agree with the above.  Armida Sans, MD

## 2022-10-31 ENCOUNTER — Telehealth: Payer: Self-pay

## 2022-10-31 NOTE — Telephone Encounter (Signed)
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

## 2022-10-31 NOTE — Telephone Encounter (Signed)
Advised pt of bx results and scheduled surgery.Justin Mckee

## 2022-11-01 ENCOUNTER — Other Ambulatory Visit: Payer: Self-pay | Admitting: Family Medicine

## 2022-11-01 ENCOUNTER — Telehealth: Payer: Self-pay

## 2022-11-01 DIAGNOSIS — K219 Gastro-esophageal reflux disease without esophagitis: Secondary | ICD-10-CM

## 2022-11-01 NOTE — Telephone Encounter (Signed)
Called [facility]- please correct pathology location   Correct location is right scalp above the right ear  They will send over corrected report

## 2022-11-01 NOTE — Telephone Encounter (Signed)
Requested medication (s) are due for refill today -unsure  Requested medication (s) are on the active medication list -yes  Future visit scheduled -yes  Last refill: 08/04/22  Notes to clinic: listed as historical provider   Requested Prescriptions  Pending Prescriptions Disp Refills   pantoprazole (PROTONIX) 20 MG tablet [Pharmacy Med Name: PANTOPRAZOLE SOD DR 20 MG TAB] 90 tablet 3    Sig: TAKE 1 TABLET BY MOUTH DAILY BEFORE BREAKFAST.     Gastroenterology: Proton Pump Inhibitors Passed - 11/01/2022  2:16 AM      Passed - Valid encounter within last 12 months    Recent Outpatient Visits           2 months ago Pre-diabetes   [place] [facility] [place], Netta Neat, DO   8 months ago Annual physical exam   [place] [facility] Smitty Cords, DO   1 year ago Essential hypertension   [place] [facility] Smitty Cords, DO   1 year ago Left groin pain   [place] [facility] Smitty Cords, DO   1 year ago Annual physical exam   [place] [facility] Smitty Cords, DO       Future Appointments             In 1 month Althea Charon, Netta Neat, DO [place] [facility], [facility]               Requested Prescriptions  Pending Prescriptions Disp Refills   pantoprazole (PROTONIX) 20 MG tablet [Pharmacy Med Name: PANTOPRAZOLE SOD DR 20 MG TAB] 90 tablet 3    Sig: TAKE 1 TABLET BY MOUTH DAILY BEFORE BREAKFAST.     Gastroenterology: Proton Pump Inhibitors Passed - 11/01/2022  2:16 AM      Passed - Valid encounter within last 12 months    Recent Outpatient Visits           2 months ago Pre-diabetes   [place] [facility] [place], Netta Neat, DO   8 months ago Annual physical exam   [place] [facility] Smitty Cords, DO   1 year ago Essential hypertension   [facility] Smitty Cords, DO   1 year ago Left groin pain   [place] [facility] Smitty Cords, DO   1 year ago Annual physical exam   [place] [facility] Smitty Cords, DO       Future Appointments             In 1 month Althea Charon, Netta Neat, DO [place] [facility], [facility]

## 2022-11-09 ENCOUNTER — Encounter: Payer: Self-pay | Admitting: Dermatology

## 2022-11-09 ENCOUNTER — Other Ambulatory Visit: Payer: Self-pay | Admitting: Family Medicine

## 2022-11-09 DIAGNOSIS — E7800 Pure hypercholesterolemia, unspecified: Secondary | ICD-10-CM

## 2022-11-09 NOTE — Telephone Encounter (Signed)
Requested Prescriptions  Refused Prescriptions Disp Refills   rosuvastatin (CRESTOR) 10 MG tablet [Pharmacy Med Name: ROSUVASTATIN CALCIUM 10 MG TAB] 45 tablet 3    Sig: TAKE 1 TABLET BY MOUTH EVERY OTHER DAY     Cardiovascular:  Antilipid - Statins 2 Failed - 11/09/2022  2:38 AM      Failed - Lipid Panel in normal range within the last 12 months    Cholesterol  Date Value Ref Range Status  03/02/2022 157 <200 mg/dL Final   LDL Cholesterol (Calc)  Date Value Ref Range Status  03/02/2022 95 mg/dL (calc) Final    Comment:    Reference range: <100 . Desirable range <100 mg/dL for primary prevention;   <70 mg/dL for patients with CHD or diabetic patients  with > or = 2 CHD risk factors. Marland Kitchen LDL-C is now calculated using the Martin-Hopkins  calculation, which is a validated novel method providing  better accuracy than the Friedewald equation in the  estimation of LDL-C.  Horald Pollen et al. Lenox Ahr. 1610;960(45): 2061-2068  (http://education.[employer].com/faq/FAQ164)    HDL  Date Value Ref Range Status  03/02/2022 48 > OR = 40 mg/dL Final   Triglycerides  Date Value Ref Range Status  03/02/2022 62 <150 mg/dL Final         Passed - Cr in normal range and within 360 days    Creat  Date Value Ref Range Status  03/02/2022 1.01 0.70 - 1.35 mg/dL Final         Passed - Patient is not pregnant      Passed - Valid encounter within last 12 months    Recent Outpatient Visits           2 months ago Pre-diabetes   [place] [facility], Netta Neat, DO   8 months ago Annual physical exam   [facility] Smitty Cords, DO   1 year ago Essential hypertension   [place] [facility] Smitty Cords, DO   1 year ago Left groin pain   [place] [facility] Smitty Cords, DO   1 year ago Annual physical exam   [place] [facility]  Smitty Cords, DO       Future Appointments             In 3 weeks Althea Charon, Netta Neat, DO [place] [facility], [facility]

## 2022-11-20 ENCOUNTER — Ambulatory Visit (INDEPENDENT_AMBULATORY_CARE_PROVIDER_SITE_OTHER): Payer: Medicare Other | Admitting: Dermatology

## 2022-11-20 DIAGNOSIS — C4491 Basal cell carcinoma of skin, unspecified: Secondary | ICD-10-CM

## 2022-11-20 DIAGNOSIS — C4441 Basal cell carcinoma of skin of scalp and neck: Secondary | ICD-10-CM

## 2022-11-20 HISTORY — DX: Basal cell carcinoma of skin, unspecified: C44.91

## 2022-11-20 MED ORDER — MUPIROCIN 2 % EX OINT
1.0000 | TOPICAL_OINTMENT | Freq: Every day | CUTANEOUS | 0 refills | Status: DC
Start: 2022-11-20 — End: 2023-05-13

## 2022-11-20 NOTE — Patient Instructions (Addendum)
Shave Excision Benign Lesion Wound Care Instructions  Leave the original bandage on for 24 hours if possible.  If the bandage becomes soaked or soiled before that time, it is OK to remove it and examine the wound.  A small amount of post-operative bleeding is normal.  If excessive bleeding occurs, remove the bandage, place gauze over the site and apply continuous pressure (no peeking) over the area for 20-30 minutes.  If this does not stop the bleeding, try again for 40 minutes.  If this does not work, please call our clinic as soon as possible (even if after-hours).    Twice a day, cleanse the wound with soap and water.  If a thick crust develops you may use a Q-tip dipped into dilute hydrogen peroxide (mix 1:1 with water) to dissolve it.  Hydrogen peroxide can slow the healing process, so use it only as needed.  After washing, apply Vaseline jelly or Polysporin ointment.  For best healing, the wound should be covered with a layer of ointment at all times.  This may mean re-applying the ointment several times a day.  For open wounds, continue until it has healed.    If you have any swelling, keep the area elevated.  Some redness, tenderness and white or yellow material in the wound is normal healing.  If the area becomes very sore and red, or develops a thick yellow-green material (pus), it may be infected; please notify us.    Wound healing continues for up to one year following surgery.  It is not unusual to experience pain in the scar from time to time during the interval.  If the pain becomes severe or the scar thickens, you should notify the office.  A slight amount of redness in a scar is expected for the first six months.  After six months, the redness subsides and the scar will soften and fade.  The color difference becomes less noticeable with time.  If there are any problems, return for a post-op surgery check at your earliest convenience.  Please call our office for any questions or  concerns.  Due to recent changes in healthcare laws, you may see results of your pathology and/or laboratory studies on MyChart before the doctors have had a chance to review them. We understand that in some cases there may be results that are confusing or concerning to you. Please understand that not all results are received at the same time and often the doctors may need to interpret multiple results in order to provide you with the best plan of care or course of treatment. Therefore, we ask that you please give us 2 business days to thoroughly review all your results before contacting the office for clarification. Should we see a critical lab result, you will be contacted sooner.   If You Need Anything After Your Visit  If you have any questions or concerns for your doctor, please call our main line at [phone] and press option 4 to reach your doctor's medical assistant. If no one answers, please leave a voicemail as directed and we will return your call as soon as possible. Messages left after 4 pm will be answered the following business day.   You may also send us a message via MyChart. We typically respond to MyChart messages within 1-2 business days.  For prescription refills, please ask your pharmacy to contact our office. Our fax number is [phone].  If you have an urgent issue when the clinic is closed that   cannot wait until the next business day, you can page your doctor at the number below.    Please note that while we do our best to be available for urgent issues outside of office hours, we are not available 24/7.   If you have an urgent issue and are unable to reach us, you may choose to seek medical care at your doctor's office, retail clinic, urgent care center, or emergency room.  If you have a medical emergency, please immediately call 911 or go to the emergency department.  Pager Numbers  - Dr. Kowalski: [phone]  - Dr. Moye: [phone]  - Dr. Stewart:  [phone]  In the event of inclement weather, please call our main line at [phone] for an update on the status of any delays or closures.  Dermatology Medication Tips: Please keep the boxes that topical medications come in in order to help keep track of the instructions about where and how to use these. Pharmacies typically print the medication instructions only on the boxes and not directly on the medication tubes.   If your medication is too expensive, please contact our office at [phone] option 4 or send us a message through MyChart.   We are unable to tell what your co-pay for medications will be in advance as this is different depending on your insurance coverage. However, we may be able to find a substitute medication at lower cost or fill out paperwork to get insurance to cover a needed medication.   If a prior authorization is required to get your medication covered by your insurance company, please allow us 1-2 business days to complete this process.  Drug prices often vary depending on where the prescription is filled and some pharmacies may offer cheaper prices.  The website www.goodrx.com contains coupons for medications through different pharmacies. The prices here do not account for what the cost may be with help from insurance (it may be cheaper with your insurance), but the website can give you the price if you did not use any insurance.  - You can print the associated coupon and take it with your prescription to the pharmacy.  - You may also stop by our office during regular business hours and pick up a GoodRx coupon card.  - If you need your prescription sent electronically to a different pharmacy, notify our office through [place] MyChart or by phone at [phone] option 4.     Si Usted Necesita Algo Despus de Su Visita  Tambin puede enviarnos un mensaje a travs de MyChart. Por lo general respondemos a los mensajes de MyChart en el transcurso de 1 a 2  das hbiles.  Para renovar recetas, por favor pida a su farmacia que se ponga en contacto con nuestra oficina. Nuestro nmero de fax es el [phone].  Si tiene un asunto urgente cuando la clnica est cerrada y que no puede esperar hasta el siguiente da hbil, puede llamar/localizar a su doctor(a) al nmero que aparece a continuacin.   Por favor, tenga en cuenta que aunque hacemos todo lo posible para estar disponibles para asuntos urgentes fuera del horario de oficina, no estamos disponibles las 24 horas del da, los 7 das de la semana.   Si tiene un problema urgente y no puede comunicarse con nosotros, puede optar por buscar atencin mdica  en el consultorio de su doctor(a), en una clnica privada, en un centro de atencin urgente o en una sala de emergencias.  Si tiene una emergencia mdica, por favor   llame inmediatamente al 911 o vaya a la sala de emergencias.  Nmeros de bper  - Dr. Kowalski: [phone]  - Dra. Moye: [phone]  - Dra. Stewart: [phone]  En caso de inclemencias del tiempo, por favor llame a nuestra lnea principal al [phone] para una actualizacin sobre el estado de cualquier retraso o cierre.  Consejos para la medicacin en dermatologa: Por favor, guarde las cajas en las que vienen los medicamentos de uso tpico para ayudarle a seguir las instrucciones sobre dnde y cmo usarlos. Las farmacias generalmente imprimen las instrucciones del medicamento slo en las cajas y no directamente en los tubos del medicamento.   Si su medicamento es muy caro, por favor, pngase en contacto con nuestra oficina llamando al [phone] y presione la opcin 4 o envenos un mensaje a travs de MyChart.   No podemos decirle cul ser su copago por los medicamentos por adelantado ya que esto es diferente dependiendo de la cobertura de su seguro. Sin embargo, es posible que podamos encontrar un medicamento sustituto a menor costo o llenar un formulario para que el  seguro cubra el medicamento que se considera necesario.   Si se requiere una autorizacin previa para que su compaa de seguros cubra su medicamento, por favor permtanos de 1 a 2 das hbiles para completar este proceso.  Los precios de los medicamentos varan con frecuencia dependiendo del lugar de dnde se surte la receta y alguna farmacias pueden ofrecer precios ms baratos.  El sitio web www.goodrx.com tiene cupones para medicamentos de diferentes farmacias. Los precios aqu no tienen en cuenta lo que podra costar con la ayuda del seguro (puede ser ms barato con su seguro), pero el sitio web puede darle el precio si no utiliz ningn seguro.  - Puede imprimir el cupn correspondiente y llevarlo con su receta a la farmacia.  - Tambin puede pasar por nuestra oficina durante el horario de atencin regular y recoger una tarjeta de cupones de GoodRx.  - Si necesita que su receta se enve electrnicamente a una farmacia diferente, informe a nuestra oficina a travs de MyChart de [place] o por telfono llamando al [phone] y presione la opcin 4.  

## 2022-11-20 NOTE — Progress Notes (Signed)
Follow-Up Visit   Subjective  Justin Mckee is a 67 y.o. male who presents for the following: Biopsy proven BCC with epidermal nevus of right scalp above right ear  The following portions of the chart were reviewed this encounter and updated as appropriate: medications, allergies, medical history  Review of Systems:  No other skin or systemic complaints except as noted in HPI or Assessment and Plan.  Objective  Well appearing patient in no apparent distress; mood and affect are within normal limits.  A focused examination was performed of the following areas: Right scalp Relevant exam findings are noted in the Assessment and Plan.  Right scalp above right ear sup        Assessment & Plan    Basal cell carcinoma (BCC) of scalp (2) Right scalp above right ear sup  Epidermal / dermal shaving  Lesion diameter (cm):  3.1 Informed consent: discussed and consent obtained   Timeout: patient name, date of birth, surgical site, and procedure verified   Procedure prep:  Patient was prepped and draped in usual sterile fashion Prep type:  Isopropyl alcohol Anesthesia: the lesion was anesthetized in a standard fashion   Anesthetic:  1% lidocaine w/ epinephrine 1-100,000 buffered w/ 8.4% NaHCO3 Instrument used: flexible razor blade   Hemostasis achieved with: pressure, aluminum chloride and electrodesiccation   Outcome: patient tolerated procedure well   Post-procedure details: sterile dressing applied and wound care instructions given   Dressing type: bandage and petrolatum    Destruction of lesion Complexity: extensive   Destruction method: electrodesiccation and curettage   Informed consent: discussed and consent obtained   Timeout:  patient name, date of birth, surgical site, and procedure verified Procedure prep:  Patient was prepped and draped in usual sterile fashion Prep type:  Isopropyl alcohol Anesthesia: the lesion was anesthetized in a standard fashion   Anesthetic:   1% lidocaine w/ epinephrine 1-100,000 buffered w/ 8.4% NaHCO3 Curettage performed in three different directions: Yes   Electrodesiccation performed over the curetted area: Yes   Lesion length (cm):  3.1 Hemostasis achieved with:  pressure and aluminum chloride Outcome: patient tolerated procedure well with no complications   Post-procedure details: sterile dressing applied and wound care instructions given   Dressing type: bandage and petrolatum    mupirocin ointment (BACTROBAN) 2 % Apply 1 Application topically daily. With dressing changes  Specimen 1 - Surgical pathology Differential Diagnosis: Biopsy proven BCC with epidermal nevus Check Margins: No  [id number]  Right scalp above right ear inf  Epidermal / dermal shaving  Lesion diameter (cm):  3.1 Informed consent: discussed and consent obtained   Timeout: patient name, date of birth, surgical site, and procedure verified   Procedure prep:  Patient was prepped and draped in usual sterile fashion Prep type:  Isopropyl alcohol Anesthesia: the lesion was anesthetized in a standard fashion   Anesthetic:  1% lidocaine w/ epinephrine 1-100,000 buffered w/ 8.4% NaHCO3 Instrument used: flexible razor blade   Hemostasis achieved with: pressure, aluminum chloride and electrodesiccation   Outcome: patient tolerated procedure well   Post-procedure details: sterile dressing applied and wound care instructions given   Dressing type: bandage and petrolatum    Destruction of lesion Complexity: extensive   Destruction method: electrodesiccation and curettage   Informed consent: discussed and consent obtained   Timeout:  patient name, date of birth, surgical site, and procedure verified Procedure prep:  Patient was prepped and draped in usual sterile fashion Prep type:  Isopropyl alcohol Anesthesia: the lesion  was anesthetized in a standard fashion   Anesthetic:  1% lidocaine w/ epinephrine 1-100,000 buffered w/ 8.4% NaHCO3 Curettage  performed in three different directions: Yes   Electrodesiccation performed over the curetted area: Yes   Final wound size (cm):  3.1 Hemostasis achieved with:  pressure, aluminum chloride and electrodesiccation Outcome: patient tolerated procedure well with no complications   Post-procedure details: sterile dressing applied and wound care instructions given   Dressing type: bandage and petrolatum    Specimen 2 - Surgical pathology Differential Diagnosis: Biopsy proven BCC with epidermal nevus Check Margins: No  [id number]  BCC with Epidermal Nevus  Discussed risk of thickened scar or keloid   Return in about 1 month (around 12/20/2022) for Follow up.  I, Joanie Coddington, CMA, am acting as scribe for Armida Sans, MD .  Documentation: I have reviewed the above documentation for accuracy and completeness, and I agree with the above.  Armida Sans, MD

## 2022-11-23 ENCOUNTER — Encounter: Payer: Self-pay | Admitting: Dermatology

## 2022-11-23 ENCOUNTER — Ambulatory Visit (INDEPENDENT_AMBULATORY_CARE_PROVIDER_SITE_OTHER): Payer: Medicare Other

## 2022-11-23 VITALS — Ht 70.0 in | Wt 197.0 lb

## 2022-11-23 DIAGNOSIS — Z0000 Encounter for general adult medical examination without abnormal findings: Secondary | ICD-10-CM

## 2022-11-23 NOTE — Patient Instructions (Signed)
[name] , Thank you for taking time to come for your Medicare Wellness Visit. I appreciate your ongoing commitment to your health goals. Please review the following plan we discussed and let me know if I can assist you in the future.   These are the goals we discussed:  Goals      DIET - EAT MORE FRUITS AND VEGETABLES        This is a list of the screening recommended for you and due dates:  Health Maintenance  Topic Date Due   COVID-19 Vaccine (5 - 2023-24 season) 02/09/2022   Screening for Lung Cancer  09/27/2022   Flu Shot  01/10/2023   Medicare Annual Wellness Visit  11/23/2023   Colon Cancer Screening  04/11/2031   Pneumonia Vaccine  Completed   Hepatitis C Screening  Completed   Zoster (Shingles) Vaccine  Completed   HPV Vaccine  Aged Out   DTaP/Tdap/Td vaccine  Discontinued    Advanced directives: no  Conditions/risks identified: none  Next appointment: Follow up in one year for your annual wellness visit. 11/29/23 @ 11:00 am by phone  Preventive Care 67 Years and Older, Male  Preventive care refers to lifestyle choices and visits with your health care provider that can promote health and wellness. What does preventive care include? A yearly physical exam. This is also called an annual well check. Dental exams once or twice a year. Routine eye exams. Ask your health care provider how often you should have your eyes checked. Personal lifestyle choices, including: Daily care of your teeth and gums. Regular physical activity. Eating a healthy diet. Avoiding tobacco and drug use. Limiting alcohol use. Practicing safe sex. Taking low doses of aspirin every day. Taking vitamin and mineral supplements as recommended by your health care provider. What happens during an annual well check? The services and screenings done by your health care provider during your annual well check will depend on your age, overall health, lifestyle risk factors, and family history of  disease. Counseling  Your health care provider may ask you questions about your: Alcohol use. Tobacco use. Drug use. Emotional well-being. Home and relationship well-being. Sexual activity. Eating habits. History of falls. Memory and ability to understand (cognition). Work and work Astronomer. Screening  You may have the following tests or measurements: Height, weight, and BMI. Blood pressure. Lipid and cholesterol levels. These may be checked every 5 years, or more frequently if you are over 67 years old. Skin check. Lung cancer screening. You may have this screening every year starting at age 67 if you have a 30-pack-year history of smoking and currently smoke or have quit within the past 15 years. Fecal occult blood test (FOBT) of the stool. You may have this test every year starting at age 67. Flexible sigmoidoscopy or colonoscopy. You may have a sigmoidoscopy every 5 years or a colonoscopy every 10 years starting at age 67. Prostate cancer screening. Recommendations will vary depending on your family history and other risks. Hepatitis C blood test. Hepatitis B blood test. Sexually transmitted disease (STD) testing. Diabetes screening. This is done by checking your blood sugar (glucose) after you have not eaten for a while (fasting). You may have this done every 1-3 years. Abdominal aortic aneurysm (AAA) screening. You may need this if you are a current or former smoker. Osteoporosis. You may be screened starting at age 67 if you are at high risk. Talk with your health care provider about your test results, treatment options, and  if necessary, the need for more tests. Vaccines  Your health care provider may recommend certain vaccines, such as: Influenza vaccine. This is recommended every year. Tetanus, diphtheria, and acellular pertussis (Tdap, Td) vaccine. You may need a Td booster every 10 years. Zoster vaccine. You may need this after age 67. Pneumococcal 13-valent  conjugate (PCV13) vaccine. One dose is recommended after age 67. Pneumococcal polysaccharide (PPSV23) vaccine. One dose is recommended after age 67. Talk to your health care provider about which screenings and vaccines you need and how often you need them. This information is not intended to replace advice given to you by your health care provider. Make sure you discuss any questions you have with your health care provider. Document Released: 06/24/2015 Document Revised: 02/15/2016 Document Reviewed: 03/29/2015 Elsevier Interactive Patient Education  2017 [place] Prevention in the Home Falls can cause injuries. They can happen to people of all ages. There are many things you can do to make your home safe and to help prevent falls. What can I do on the outside of my home? Regularly fix the edges of walkways and driveways and fix any cracks. Remove anything that might make you trip as you walk through a door, such as a raised step or threshold. Trim any bushes or trees on the path to your home. Use bright outdoor lighting. Clear any walking paths of anything that might make someone trip, such as rocks or tools. Regularly check to see if handrails are loose or broken. Make sure that both sides of any steps have handrails. Any raised decks and porches should have guardrails on the edges. Have any leaves, snow, or ice cleared regularly. Use sand or salt on walking paths during winter. Clean up any spills in your garage right away. This includes oil or grease spills. What can I do in the bathroom? Use night lights. Install grab bars by the toilet and in the tub and shower. Do not use towel bars as grab bars. Use non-skid mats or decals in the tub or shower. If you need to sit down in the shower, use a plastic, non-slip stool. Keep the floor dry. Clean up any water that spills on the floor as soon as it happens. Remove soap buildup in the tub or shower regularly. Attach bath mats  securely with double-sided non-slip rug tape. Do not have throw rugs and other things on the floor that can make you trip. What can I do in the bedroom? Use night lights. Make sure that you have a light by your bed that is easy to reach. Do not use any sheets or blankets that are too big for your bed. They should not hang down onto the floor. Have a firm chair that has side arms. You can use this for support while you get dressed. Do not have throw rugs and other things on the floor that can make you trip. What can I do in the kitchen? Clean up any spills right away. Avoid walking on wet floors. Keep items that you use a lot in easy-to-reach places. If you need to reach something above you, use a strong step stool that has a grab bar. Keep electrical cords out of the way. Do not use floor polish or wax that makes floors slippery. If you must use wax, use non-skid floor wax. Do not have throw rugs and other things on the floor that can make you trip. What can I do with my stairs? Do not leave any  items on the stairs. Make sure that there are handrails on both sides of the stairs and use them. Fix handrails that are broken or loose. Make sure that handrails are as long as the stairways. Check any carpeting to make sure that it is firmly attached to the stairs. Fix any carpet that is loose or worn. Avoid having throw rugs at the top or bottom of the stairs. If you do have throw rugs, attach them to the floor with carpet tape. Make sure that you have a light switch at the top of the stairs and the bottom of the stairs. If you do not have them, ask someone to add them for you. What else can I do to help prevent falls? Wear shoes that: Do not have high heels. Have rubber bottoms. Are comfortable and fit you well. Are closed at the toe. Do not wear sandals. If you use a stepladder: Make sure that it is fully opened. Do not climb a closed stepladder. Make sure that both sides of the stepladder  are locked into place. Ask someone to hold it for you, if possible. Clearly mark and make sure that you can see: Any grab bars or handrails. First and last steps. Where the edge of each step is. Use tools that help you move around (mobility aids) if they are needed. These include: Canes. Walkers. Scooters. Crutches. Turn on the lights when you go into a dark area. Replace any light bulbs as soon as they burn out. Set up your furniture so you have a clear path. Avoid moving your furniture around. If any of your floors are uneven, fix them. If there are any pets around you, be aware of where they are. Review your medicines with your doctor. Some medicines can make you feel dizzy. This can increase your chance of falling. Ask your doctor what other things that you can do to help prevent falls. This information is not intended to replace advice given to you by your health care provider. Make sure you discuss any questions you have with your health care provider. Document Released: 03/24/2009 Document Revised: 11/03/2015 Document Reviewed: 07/02/2014 Elsevier Interactive Patient Education  2017 [employer].
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
## 2022-11-28 ENCOUNTER — Telehealth: Payer: Self-pay

## 2022-11-28 NOTE — Telephone Encounter (Signed)
-----   Message from Deirdre Evener, MD sent at 11/27/2022 10:31 AM EDT ----- Diagnosis 1. Skin , right scalp above right ear sup SUPERFICIAL BASAL CELL CARCINOMA, PINKUS TYPE (FIBROEPITHELIAL TUMOR OF PINKUS) 2. Skin , right scalp above right ear inf SYRINGOCYSTADENOMA PAPILLIFERUM, NEVUS SEBACEUS OF [place] AND SUPERFICIAL BASAL CELL CARCINOMA, PINKUS TYPE (FIBROEPITHELIAL TUMOR OF PINKUS)  1&2 - consistent with Nevus Sebaceous with Cancer / BCC transformation Both areas already treated Recheck next visit

## 2022-11-28 NOTE — Telephone Encounter (Signed)
Patient informed of pathology results. He didn't have an appointment scheduled, but he states that he has an appt card with 12/20/22 at 10:00 am, so I scheduled him for that day and time.

## 2022-12-03 ENCOUNTER — Ambulatory Visit: Payer: Medicare Other | Admitting: Family Medicine

## 2022-12-05 ENCOUNTER — Encounter: Payer: Self-pay | Admitting: Family Medicine

## 2022-12-05 ENCOUNTER — Ambulatory Visit (INDEPENDENT_AMBULATORY_CARE_PROVIDER_SITE_OTHER): Payer: Medicare Other | Admitting: Family Medicine

## 2022-12-05 VITALS — BP 138/70 | HR 63 | Temp 98.4°F | Ht 70.0 in | Wt 195.8 lb

## 2022-12-05 DIAGNOSIS — R918 Other nonspecific abnormal finding of lung field: Secondary | ICD-10-CM

## 2022-12-05 DIAGNOSIS — R7303 Prediabetes: Secondary | ICD-10-CM

## 2022-12-05 DIAGNOSIS — I700 Atherosclerosis of aorta: Secondary | ICD-10-CM

## 2022-12-05 DIAGNOSIS — I10 Essential (primary) hypertension: Secondary | ICD-10-CM | POA: Diagnosis not present

## 2022-12-05 LAB — POCT GLYCOSYLATED HEMOGLOBIN (HGB A1C): Hemoglobin A1C: 6.6 % — AB (ref 4.0–5.6)

## 2022-12-05 NOTE — Progress Notes (Unsigned)
Subjective:    Patient ID: Justin Mckee, male    DOB: 06/13/1955, 67 y.o.   MRN: [id number]  Justin Mckee is a 67 y.o. male presenting on 12/05/2022 for Diabetes   HPI  Chronic Hypertension Checking BP at home avg 130/80s, doing well, no concerns. Meds - Amlodipine 10mg  daily Denies Headaches, CP, dyspnea, edema, dizziness / lightheadedness, numbness weakness, hearing loss   HYPERLIPIDEMIA: - Reports no concerns. Last lipid panel 02/2022, improved on Statin however had symptoms was switched to intermittent - Currently taking Rosuvastatin 10mg  every OTHER day or up to every 3rd day now, tolerating well with only mild myalgia ache   Pre-Diabetes Prior A1c 6.1 to 6.3 range Lately has had A1c 6.6 slightly elevated range. Due today for A1c Meds: never on meds Lifestyle Weight up - Diet admits had been drinking more sodas again and sugar in coffee - Exercise physically active at work and doing housework Denies hypoglycemia, polyuria   Admits some Left sided back/flank aching Taking occasional muscle relaxant with relief Usually short term symptoms.  Aorta Atherosclerosis On imaging LDCT On Statin therapy  Pulm Nodules on imaging surveillance previously  Health Maintenance:      12/05/2022    8:20 AM 11/23/2022    3:03 PM 08/31/2022    8:11 AM  Depression screen PHQ 2/9  Decreased Interest 0 0 0  Down, Depressed, Hopeless 0 0 0  PHQ - 2 Score 0 0 0  Altered sleeping 1 0 1  Tired, decreased energy 0 0 0  Change in appetite 0 0 1  Feeling bad or failure about yourself  0 0 0  Trouble concentrating 0 0 0  Moving slowly or fidgety/restless 0 0 0  Suicidal thoughts 0 0 0  PHQ-9 Score 1 0 2  Difficult doing work/chores Not difficult at all Not difficult at all Not difficult at all    Social History   Tobacco Use   Smoking status: Former    Packs/day: 1.00    Years: 40.00    Additional pack years: 0.00    Total pack years: 40.00    Types: Cigarettes    Quit date:  06/01/2016    Years since quitting: 6.5   Smokeless tobacco: Former    Quit date: 08/28/2015  Vaping Use   Vaping Use: Never used  Substance Use Topics   Alcohol use: Yes    Alcohol/week: 12.0 standard drinks of alcohol    Types: 12 Cans of beer per week    Comment: weekly   Drug use: No    Review of Systems Per HPI unless specifically indicated above     Objective:    BP 138/70 (BP Location: Right Arm, Patient Position: Sitting)   Pulse 63   Temp 98.4 F (36.9 C) (Oral)   Ht 5\' 10"  (1.778 m)   Wt 195 lb 12.8 oz (88.8 kg)   SpO2 96%   BMI 28.09 kg/m   Wt Readings from Last 3 Encounters:  12/05/22 195 lb 12.8 oz (88.8 kg)  11/23/22 197 lb (89.4 kg)  08/31/22 197 lb 9.6 oz (89.6 kg)    Physical Exam Vitals and nursing note reviewed.  Constitutional:      General: He is not in acute distress.    Appearance: Normal appearance. He is well-developed. He is not diaphoretic.     Comments: Well-appearing, comfortable, cooperative  HENT:     Head: Normocephalic and atraumatic.  Eyes:     General:  Right eye: No discharge.        Left eye: No discharge.     Conjunctiva/sclera: Conjunctivae normal.  Cardiovascular:     Rate and Rhythm: Normal rate.  Pulmonary:     Effort: Pulmonary effort is normal.  Skin:    General: Skin is warm and dry.     Findings: No erythema or rash.  Neurological:     Mental Status: He is alert and oriented to person, place, and time.  Psychiatric:        Mood and Affect: Mood normal.        Behavior: Behavior normal.        Thought Content: Thought content normal.     Comments: Well groomed, good eye contact, normal speech and thoughts      Results for orders placed or performed in visit on 12/05/22  POCT glycosylated hemoglobin (Hb A1C)  Result Value Ref Range   Hemoglobin A1C 6.6 (A) 4.0 - 5.6 %      Assessment & Plan:   Problem List Items Addressed This Visit     Atherosclerosis of aorta (HCC)    On imaging CT On  Statin therapy      Essential hypertension    Controlled BP No known complications    Plan:  1. Continue Amlodipine 10mg  daily HTN therapy 2. Encourage improved lifestyle - low sodium diet, regular exercise 3. Continue monitor BP outside office, bring readings to next visit, if persistently >140/90 or new symptoms notify office sooner  If needed consider 2nd agent Thiazide.      Multiple pulmonary nodules    Ordered LDCT Pulm nodule imaging follow up      Relevant Orders   CT CHEST NODULE FOLLOW UP LOW DOSE W/O   Pre-diabetes - Primary    Again A1c up to 6.6, similar to prior range 6.3 to 6.6 Consider T2DM in near future if unable to resolve with lifestyle management diet changes  Defer Metformin start today, he will try to resolve A1c back to PreDM in 3 months and if not we can pursue DM therapy  Encourage improved lifestyle - low carb, low sugar diet, reduce portion size, continue improving regular exercise      Relevant Orders   POCT glycosylated hemoglobin (Hb A1C) (Completed)    No orders of the defined types were placed in this encounter.   Follow up plan: Return in about 3 months (around 03/07/2023) for 3 month fasting lab only then 2-3 days Annual Physical.  Future labs ordered for 02/2023   Saralyn Pilar, DO [facility] 12/05/2022, 8:35 AM

## 2022-12-05 NOTE — Patient Instructions (Addendum)
Thank you for coming to the office today.  Recent Labs    03/02/22 0909 08/31/22 0818 12/05/22 0838  HGBA1C 6.3* 6.6* 6.6*   Keep trying to reduce blood sugar.  We can consider Metformin in future  Low Dose CT Scan Lungs ordered, they will call you to schedule.   DUE for FASTING BLOOD WORK (no food or drink after midnight before the lab appointment, only water or coffee without cream/sugar on the morning of)  SCHEDULE "Lab Only" visit in the morning at the clinic for lab draw in 3 MONTHS   - Make sure Lab Only appointment is at about 1 week before your next appointment, so that results will be available  For Lab Results, once available within 2-3 days of blood draw, you can can log in to MyChart online to view your results and a brief explanation. Also, we can discuss results at next follow-up visit.   Please schedule a Follow-up Appointment to: Return in about 3 months (around 03/07/2023) for 3 month fasting lab only then 2-3 days Annual Physical.  If you have any other questions or concerns, please feel free to call the office or send a message through MyChart. You may also schedule an earlier appointment if necessary.  Additionally, you may be receiving a survey about your experience at our office within a few days to 1 week by e-mail or mail. We value your feedback.  Saralyn Pilar, DO [facility], [place]

## 2022-12-06 ENCOUNTER — Other Ambulatory Visit: Payer: Self-pay | Admitting: Family Medicine

## 2022-12-06 ENCOUNTER — Encounter: Payer: Self-pay | Admitting: Family Medicine

## 2022-12-06 DIAGNOSIS — Z8546 Personal history of malignant neoplasm of prostate: Secondary | ICD-10-CM

## 2022-12-06 DIAGNOSIS — I10 Essential (primary) hypertension: Secondary | ICD-10-CM

## 2022-12-06 DIAGNOSIS — E7800 Pure hypercholesterolemia, unspecified: Secondary | ICD-10-CM

## 2022-12-06 DIAGNOSIS — R7303 Prediabetes: Secondary | ICD-10-CM

## 2022-12-06 DIAGNOSIS — I700 Atherosclerosis of aorta: Secondary | ICD-10-CM | POA: Insufficient documentation

## 2022-12-06 DIAGNOSIS — Z0000 Encounter for general adult medical examination without abnormal findings: Secondary | ICD-10-CM

## 2022-12-06 DIAGNOSIS — E559 Vitamin D deficiency, unspecified: Secondary | ICD-10-CM

## 2022-12-06 NOTE — Assessment & Plan Note (Signed)
Controlled BP No known complications    Plan:  1. Continue Amlodipine 10mg daily HTN therapy 2. Encourage improved lifestyle - low sodium diet, regular exercise 3. Continue monitor BP outside office, bring readings to next visit, if persistently >140/90 or new symptoms notify office sooner  If needed consider 2nd agent Thiazide. 

## 2022-12-06 NOTE — Assessment & Plan Note (Signed)
Ordered LDCT Pulm nodule imaging follow up

## 2022-12-06 NOTE — Assessment & Plan Note (Signed)
On imaging CT On Statin therapy

## 2022-12-06 NOTE — Assessment & Plan Note (Signed)
Again A1c up to 6.6, similar to prior range 6.3 to 6.6 Consider T2DM in near future if unable to resolve with lifestyle management diet changes  Defer Metformin start today, he will try to resolve A1c back to PreDM in 3 months and if not we can pursue DM therapy  Encourage improved lifestyle - low carb, low sugar diet, reduce portion size, continue improving regular exercise

## 2022-12-10 ENCOUNTER — Ambulatory Visit
Admission: RE | Admit: 2022-12-10 | Discharge: 2022-12-10 | Disposition: A | Discharge status: Home / Self Care | Payer: Medicare Other | Source: Ambulatory Visit | Attending: Family Medicine | Admitting: Family Medicine

## 2022-12-10 DIAGNOSIS — F17210 Nicotine dependence, cigarettes, uncomplicated: Secondary | ICD-10-CM | POA: Diagnosis not present

## 2022-12-10 DIAGNOSIS — R918 Other nonspecific abnormal finding of lung field: Secondary | ICD-10-CM | POA: Diagnosis not present

## 2022-12-20 ENCOUNTER — Ambulatory Visit: Payer: Medicare Other | Admitting: Dermatology

## 2022-12-20 VITALS — BP 130/76

## 2022-12-20 DIAGNOSIS — Z85828 Personal history of other malignant neoplasm of skin: Secondary | ICD-10-CM

## 2022-12-20 DIAGNOSIS — L578 Other skin changes due to chronic exposure to nonionizing radiation: Secondary | ICD-10-CM | POA: Diagnosis not present

## 2022-12-20 DIAGNOSIS — W908XXA Exposure to other nonionizing radiation, initial encounter: Secondary | ICD-10-CM | POA: Diagnosis not present

## 2022-12-20 NOTE — Patient Instructions (Signed)
Due to recent changes in healthcare laws, you may see results of your pathology and/or laboratory studies on MyChart before the doctors have had a chance to review them. We understand that in some cases there may be results that are confusing or concerning to you. Please understand that not all results are received at the same time and often the doctors may need to interpret multiple results in order to provide you with the best plan of care or course of treatment. Therefore, we ask that you please give us 2 business days to thoroughly review all your results before contacting the office for clarification. Should we see a critical lab result, you will be contacted sooner.   If You Need Anything After Your Visit  If you have any questions or concerns for your doctor, please call our main line at [phone] and press option 4 to reach your doctor's medical assistant. If no one answers, please leave a voicemail as directed and we will return your call as soon as possible. Messages left after 4 pm will be answered the following business day.   You may also send us a message via MyChart. We typically respond to MyChart messages within 1-2 business days.  For prescription refills, please ask your pharmacy to contact our office. Our fax number is [phone].  If you have an urgent issue when the clinic is closed that cannot wait until the next business day, you can page your doctor at the number below.    Please note that while we do our best to be available for urgent issues outside of office hours, we are not available 24/7.   If you have an urgent issue and are unable to reach us, you may choose to seek medical care at your doctor's office, retail clinic, urgent care center, or emergency room.  If you have a medical emergency, please immediately call 911 or go to the emergency department.  Pager Numbers  - Dr. Kowalski: [phone]  - Dr. Moye: [phone]  - Dr. Stewart:  [phone]  In the event of inclement weather, please call our main line at [phone] for an update on the status of any delays or closures.  Dermatology Medication Tips: Please keep the boxes that topical medications come in in order to help keep track of the instructions about where and how to use these. Pharmacies typically print the medication instructions only on the boxes and not directly on the medication tubes.   If your medication is too expensive, please contact our office at [phone] option 4 or send us a message through MyChart.   We are unable to tell what your co-pay for medications will be in advance as this is different depending on your insurance coverage. However, we may be able to find a substitute medication at lower cost or fill out paperwork to get insurance to cover a needed medication.   If a prior authorization is required to get your medication covered by your insurance company, please allow us 1-2 business days to complete this process.  Drug prices often vary depending on where the prescription is filled and some pharmacies may offer cheaper prices.  The website www.goodrx.com contains coupons for medications through different pharmacies. The prices here do not account for what the cost may be with help from insurance (it may be cheaper with your insurance), but the website can give you the price if you did not use any insurance.  - You can print the associated coupon and take it with   your prescription to the pharmacy.  - You may also stop by our office during regular business hours and pick up a GoodRx coupon card.  - If you need your prescription sent electronically to a different pharmacy, notify our office through Industry MyChart or by phone at [phone] option 4.     Si Usted Necesita Algo Despus de Su Visita  Tambin puede enviarnos un mensaje a travs de MyChart. Por lo general respondemos a los mensajes de MyChart en el transcurso de 1 a 2  das hbiles.  Para renovar recetas, por favor pida a su farmacia que se ponga en contacto con nuestra oficina. Nuestro nmero de fax es el [phone].  Si tiene un asunto urgente cuando la clnica est cerrada y que no puede esperar hasta el siguiente da hbil, puede llamar/localizar a su doctor(a) al nmero que aparece a continuacin.   Por favor, tenga en cuenta que aunque hacemos todo lo posible para estar disponibles para asuntos urgentes fuera del horario de oficina, no estamos disponibles las 24 horas del da, los 7 das de la semana.   Si tiene un problema urgente y no puede comunicarse con nosotros, puede optar por buscar atencin mdica  en el consultorio de su doctor(a), en una clnica privada, en un centro de atencin urgente o en una sala de emergencias.  Si tiene una emergencia mdica, por favor llame inmediatamente al 911 o vaya a la sala de emergencias.  Nmeros de bper  - Dr. Kowalski: [phone]  - Dra. Moye: [phone]  - Dra. Stewart: [phone]  En caso de inclemencias del tiempo, por favor llame a nuestra lnea principal al [phone] para una actualizacin sobre el estado de cualquier retraso o cierre.  Consejos para la medicacin en dermatologa: Por favor, guarde las cajas en las que vienen los medicamentos de uso tpico para ayudarle a seguir las instrucciones sobre dnde y cmo usarlos. Las farmacias generalmente imprimen las instrucciones del medicamento slo en las cajas y no directamente en los tubos del medicamento.   Si su medicamento es muy caro, por favor, pngase en contacto con nuestra oficina llamando al [phone] y presione la opcin 4 o envenos un mensaje a travs de MyChart.   No podemos decirle cul ser su copago por los medicamentos por adelantado ya que esto es diferente dependiendo de la cobertura de su seguro. Sin embargo, es posible que podamos encontrar un medicamento sustituto a menor costo o llenar un formulario para que el  seguro cubra el medicamento que se considera necesario.   Si se requiere una autorizacin previa para que su compaa de seguros cubra su medicamento, por favor permtanos de 1 a 2 das hbiles para completar este proceso.  Los precios de los medicamentos varan con frecuencia dependiendo del lugar de dnde se surte la receta y alguna farmacias pueden ofrecer precios ms baratos.  El sitio web www.goodrx.com tiene cupones para medicamentos de diferentes farmacias. Los precios aqu no tienen en cuenta lo que podra costar con la ayuda del seguro (puede ser ms barato con su seguro), pero el sitio web puede darle el precio si no utiliz ningn seguro.  - Puede imprimir el cupn correspondiente y llevarlo con su receta a la farmacia.  - Tambin puede pasar por nuestra oficina durante el horario de atencin regular y recoger una tarjeta de cupones de GoodRx.  - Si necesita que su receta se enve electrnicamente a una farmacia diferente, informe a nuestra oficina a travs de MyChart de [place]   o por telfono llamando al [phone] y presione la opcin 4.  

## 2022-12-20 NOTE — Progress Notes (Signed)
   Follow-Up Visit   Subjective  Brenen Mckee is a 67 y.o. male who presents for the following: SUPERFICIAL BASAL CELL CARCINOMA, PINKUS TYPE (FIBROEPITHELIAL TUMOR OF PINKUS) bx proven of the R scalp above right ear sup and R scalp above the right ear infer, bx and [facility] 11/20/22   The following portions of the chart were reviewed this encounter and updated as appropriate: medications, allergies, medical history  Review of Systems:  No other skin or systemic complaints except as noted in HPI or Assessment and Plan.  Objective  Well appearing patient in no apparent distress; mood and affect are within normal limits.   A focused examination was performed of the following areas: scalp  Relevant exam findings are noted in the Assessment and Plan.    Assessment & Plan   HISTORY SUPERFICIAL BASAL CELL CARCINOMA, PINKUS TYPE (FIBROEPITHELIAL TUMOR OF PINKUS) / Nevus Sebaceous with Cancer / BCC transformation  Bx proven and treated with EDC R scalp above right ear sup and R scalp above the right ear inf Exam: treatment sites clear today, no evidence of recurrence  Treatment Plan: Clear. Observe for recurrence. Call clinic for new or changing lesions.  Recommend regular skin exams, daily broad-spectrum spf 30+ sunscreen use, and photoprotection.      ACTINIC DAMAGE - chronic, secondary to cumulative UV radiation exposure/sun exposure over time - diffuse scaly erythematous macules with underlying dyspigmentation - Recommend daily broad spectrum sunscreen SPF 30+ to sun-exposed areas, reapply every 2 hours as needed.  - Recommend staying in the shade or wearing long sleeves, sun glasses (UVA+UVB protection) and wide brim hats (4-inch brim around the entire circumference of the hat). - Call for new or changing lesions.   Return in about 8 months (around 08/20/2023) for b/u hx of BCCs.  I, Ardis Rowan, RMA, am acting as scribe for Armida Sans, MD .   Documentation: I have reviewed the  above documentation for accuracy and completeness, and I agree with the above.  Armida Sans, MD

## 2022-12-21 ENCOUNTER — Encounter: Payer: Self-pay | Admitting: Dermatology

## 2023-01-28 ENCOUNTER — Other Ambulatory Visit: Payer: Self-pay | Admitting: Internal Medicine

## 2023-01-28 DIAGNOSIS — K219 Gastro-esophageal reflux disease without esophagitis: Secondary | ICD-10-CM

## 2023-01-29 NOTE — Telephone Encounter (Signed)
Requested Prescriptions  Pending Prescriptions Disp Refills   pantoprazole (PROTONIX) 20 MG tablet [Pharmacy Med Name: PANTOPRAZOLE SOD DR 20 MG TAB] 90 tablet 0    Sig: TAKE 1 TABLET BY MOUTH EVERY DAY BEFORE BREAKFAST     Gastroenterology: Proton Pump Inhibitors Passed - 01/28/2023  2:40 AM      Passed - Valid encounter within last 12 months    Recent Outpatient Visits           1 month ago Pre-diabetes   [place] [facility] Hartly, Netta Neat, DO   5 months ago Pre-diabetes   [place] [facility] Smitty Cords, DO   11 months ago Annual physical exam   [place] [facility] Smitty Cords, DO   1 year ago Essential hypertension   [place] [facility] Smitty Cords, DO   1 year ago Left groin pain   [place] [facility] Smitty Cords, DO       Future Appointments             In 1 month Althea Charon, Netta Neat, DO [facility], [place]   In 6 months Deirdre Evener, MD [facility]

## 2023-02-28 ENCOUNTER — Other Ambulatory Visit: Payer: Medicare Other

## 2023-03-04 ENCOUNTER — Other Ambulatory Visit: Payer: Medicare Other

## 2023-03-04 DIAGNOSIS — R7303 Prediabetes: Secondary | ICD-10-CM | POA: Diagnosis not present

## 2023-03-04 DIAGNOSIS — Z0000 Encounter for general adult medical examination without abnormal findings: Secondary | ICD-10-CM

## 2023-03-04 DIAGNOSIS — I10 Essential (primary) hypertension: Secondary | ICD-10-CM | POA: Diagnosis not present

## 2023-03-04 DIAGNOSIS — E7800 Pure hypercholesterolemia, unspecified: Secondary | ICD-10-CM | POA: Diagnosis not present

## 2023-03-04 DIAGNOSIS — Z8546 Personal history of malignant neoplasm of prostate: Secondary | ICD-10-CM

## 2023-03-04 DIAGNOSIS — E559 Vitamin D deficiency, unspecified: Secondary | ICD-10-CM

## 2023-03-04 DIAGNOSIS — I700 Atherosclerosis of aorta: Secondary | ICD-10-CM | POA: Diagnosis not present

## 2023-03-05 LAB — CBC WITH DIFFERENTIAL/PLATELET
Absolute Monocytes: 551 cells/uL (ref 200–950)
Basophils Absolute: 48 cells/uL (ref 0–200)
Basophils Relative: 0.9 %
Eosinophils Absolute: 58 cells/uL (ref 15–500)
Eosinophils Relative: 1.1 %
HCT: 41.6 % (ref 38.5–50.0)
Hemoglobin: 13.5 g/dL (ref 13.2–17.1)
Lymphs Abs: 2062 cells/uL (ref 850–3900)
MCH: 29.7 pg (ref 27.0–33.0)
MCHC: 32.5 g/dL (ref 32.0–36.0)
MCV: 91.6 fL (ref 80.0–100.0)
MPV: 9.7 fL (ref 7.5–12.5)
Monocytes Relative: 10.4 %
Neutro Abs: 2581 cells/uL (ref 1500–7800)
Neutrophils Relative %: 48.7 %
Platelets: 319 10*3/uL (ref 140–400)
RBC: 4.54 10*6/uL (ref 4.20–5.80)
RDW: 14.4 % (ref 11.0–15.0)
Total Lymphocyte: 38.9 %
WBC: 5.3 10*3/uL (ref 3.8–10.8)

## 2023-03-05 LAB — VITAMIN D 25 HYDROXY (VIT D DEFICIENCY, FRACTURES): Vit D, 25-Hydroxy: 18 ng/mL — ABNORMAL LOW (ref 30–100)

## 2023-03-05 LAB — LIPID PANEL
Cholesterol: 171 mg/dL (ref <200)
HDL: 48 mg/dL (ref >=40)
LDL Cholesterol (Calc): 105 mg/dL (calc) — ABNORMAL HIGH
Non-HDL Cholesterol (Calc): 123 mg/dL (calc) (ref <130)
Total CHOL/HDL Ratio: 3.6 (calc) (ref <5.0)
Triglycerides: 89 mg/dL (ref <150)

## 2023-03-05 LAB — COMPLETE METABOLIC PANEL WITH GFR
AG Ratio: 1.3 (calc) (ref 1.0–2.5)
ALT: 20 U/L (ref 9–46)
AST: 14 U/L (ref 10–35)
Albumin: 4.3 g/dL (ref 3.6–5.1)
Alkaline phosphatase (APISO): 35 U/L (ref 35–144)
BUN: 15 mg/dL (ref 7–25)
CO2: 26 mmol/L (ref 20–32)
Calcium: 9.8 mg/dL (ref 8.6–10.3)
Chloride: 103 mmol/L (ref 98–110)
Creat: 1.01 mg/dL (ref 0.70–1.35)
Globulin: 3.2 g/dL (calc) (ref 1.9–3.7)
Glucose, Bld: 120 mg/dL — ABNORMAL HIGH (ref 65–99)
Potassium: 4.3 mmol/L (ref 3.5–5.3)
Sodium: 138 mmol/L (ref 135–146)
Total Bilirubin: 0.8 mg/dL (ref 0.2–1.2)
Total Protein: 7.5 g/dL (ref 6.1–8.1)
eGFR: 82 mL/min/{1.73_m2} (ref >=60)

## 2023-03-05 LAB — HEMOGLOBIN A1C
Hgb A1c MFr Bld: 6.7 % of total Hgb — ABNORMAL HIGH (ref <5.7)
Mean Plasma Glucose: 146 mg/dL
eAG (mmol/L): 8.1 mmol/L

## 2023-03-05 LAB — TSH: TSH: 1.83 mIU/L (ref 0.40–4.50)

## 2023-03-05 LAB — PSA: PSA: 0.18 ng/mL (ref <=4.00)

## 2023-03-07 ENCOUNTER — Encounter: Payer: Self-pay | Admitting: Family Medicine

## 2023-03-07 ENCOUNTER — Ambulatory Visit: Payer: Medicare Other | Admitting: Family Medicine

## 2023-03-07 VITALS — BP 136/82 | HR 67 | Ht 68.5 in | Wt 196.0 lb

## 2023-03-07 DIAGNOSIS — Z23 Encounter for immunization: Secondary | ICD-10-CM | POA: Diagnosis not present

## 2023-03-07 DIAGNOSIS — I10 Essential (primary) hypertension: Secondary | ICD-10-CM | POA: Diagnosis not present

## 2023-03-07 DIAGNOSIS — E7800 Pure hypercholesterolemia, unspecified: Secondary | ICD-10-CM

## 2023-03-07 DIAGNOSIS — E559 Vitamin D deficiency, unspecified: Secondary | ICD-10-CM | POA: Diagnosis not present

## 2023-03-07 DIAGNOSIS — R7303 Prediabetes: Secondary | ICD-10-CM | POA: Diagnosis not present

## 2023-03-07 DIAGNOSIS — I700 Atherosclerosis of aorta: Secondary | ICD-10-CM

## 2023-03-07 DIAGNOSIS — Z8546 Personal history of malignant neoplasm of prostate: Secondary | ICD-10-CM

## 2023-03-07 DIAGNOSIS — Z0000 Encounter for general adult medical examination without abnormal findings: Secondary | ICD-10-CM | POA: Diagnosis not present

## 2023-03-07 MED ORDER — VITAMIN D (ERGOCALCIFEROL) 1.25 MG (50000 UNIT) PO CAPS: 50000.0000 [IU] | ORAL_CAPSULE | ORAL | 0 refills | Status: DC

## 2023-03-07 NOTE — Assessment & Plan Note (Signed)
Controlled BP No known complications    Plan:  1. Continue Amlodipine 10mg  daily HTN therapy 2. Encourage improved lifestyle - low sodium diet, regular exercise 3. Continue monitor BP outside office, bring readings to next visit, if persistently >140/90 or new symptoms notify office sooner  If needed consider 2nd agent Thiazide.
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
## 2023-03-07 NOTE — Patient Instructions (Addendum)
Thank you for coming to the office today.  Debrox Ear Drops for wax removal.  Recent Labs    08/31/22 0818 12/05/22 0838 03/04/23 0813  HGBA1C 6.6* 6.6* 6.7*   Goal to improve diet further to prevent Diabetes  Diet Recommendations for Preventing Diabetes   REDUCE Starchy (carb) foods include: Bread, rice, pasta, potatoes, corn, crackers, bagels, muffins, all baked goods.   FRUITS - LIMIT these HIGH sugar/carb fruits = Pineapple, Watermelon, Bananas - OKAY with these MEDIUM sugar/carb fruits = Citrus, Oranges, Grapes - PREFER these LOW sugar/carb fruits = Apples, Berries, Pears, Plums  Protein foods include: Meat, fish, poultry, eggs, dairy foods, and beans such as pinto and kidney beans (beans also provide carbohydrate).   1. Eat at least 3 meals and 1-2 snacks per day. Never go more than 4-5 hours while awake without eating.   2. Limit starchy foods to TWO per meal and ONE per snack. ONE portion of a starchy  food is equal to the following:   - ONE slice of bread (or its equivalent, such as half of a hamburger bun).   - 1/2 cup of a "scoopable" starchy food such as potatoes or rice.   - 1 OUNCE (28 grams) of starchy snacks (crackers or pretzels, look on label).   - 15 grams of carbohydrate as shown on food label.   3. Both lunch and dinner should include a protein food, a carb food, and vegetables.   - Obtain twice as many veg's as protein or carbohydrate foods for both lunch and dinner.   - Try to keep frozen veg's on hand for a quick vegetable serving.     - Fresh or frozen veg's are best.   4. Breakfast should always include protein.    Diabetes Mellitus and Nutrition, Adult When you have diabetes, or diabetes mellitus, it is very important to have healthy eating habits because your blood sugar (glucose) levels are greatly affected by what you eat and drink. Eating healthy foods in the right amounts, at about the same times every day, can help you: Manage your blood  glucose. Lower your risk of heart disease. Improve your blood pressure. Reach or maintain a healthy weight. What can affect my meal plan? Every person with diabetes is different, and each person has different needs for a meal plan. Your health care provider may recommend that you work with a dietitian to make a meal plan that is best for you. Your meal plan may vary depending on factors such as: The calories you need. The medicines you take. Your weight. Your blood glucose, blood pressure, and cholesterol levels. Your activity level. Other health conditions you have, such as heart or kidney disease. How do carbohydrates affect me? Carbohydrates, also called carbs, affect your blood glucose level more than any other type of food. Eating carbs raises the amount of glucose in your blood. It is important to know how many carbs you can safely have in each meal. This is different for every person. Your dietitian can help you calculate how many carbs you should have at each meal and for each snack. How does alcohol affect me? Alcohol can cause a decrease in blood glucose (hypoglycemia), especially if you use insulin or take certain diabetes medicines by mouth. Hypoglycemia can be a life-threatening condition. Symptoms of hypoglycemia, such as sleepiness, dizziness, and confusion, are similar to symptoms of having too much alcohol. Do not drink alcohol if: Your health care provider tells you not to drink. You  are pregnant, may be pregnant, or are planning to become pregnant. If you drink alcohol: Limit how much you have to: 0-1 drink a day for women. 0-2 drinks a day for men. Know how much alcohol is in your drink. In the [place] one drink equals one 12 oz bottle of beer (355 mL), one 5 oz glass of wine (148 mL), or one 1 oz glass of hard liquor (44 mL). Keep yourself hydrated with water, diet soda, or unsweetened iced tea. Keep in mind that regular soda, juice, and other mixers may contain a lot of  sugar and must be counted as carbs. What are tips for following this plan?  Reading food labels Start by checking the serving size on the Nutrition Facts label of packaged foods and drinks. The number of calories and the amount of carbs, fats, and other nutrients listed on the label are based on one serving of the item. Many items contain more than one serving per package. Check the total grams (g) of carbs in one serving. Check the number of grams of saturated fats and trans fats in one serving. Choose foods that have a low amount or none of these fats. Check the number of milligrams (mg) of salt (sodium) in one serving. Most people should limit total sodium intake to less than 2,300 mg per day. Always check the nutrition information of foods labeled as "low-fat" or "nonfat." These foods may be higher in added sugar or refined carbs and should be avoided. Talk to your dietitian to identify your daily goals for nutrients listed on the label. Shopping Avoid buying canned, pre-made, or processed foods. These foods tend to be high in fat, sodium, and added sugar. Shop around the outside edge of the grocery store. This is where you will most often find fresh fruits and vegetables, bulk grains, fresh meats, and fresh dairy products. Cooking Use low-heat cooking methods, such as baking, instead of high-heat cooking methods, such as deep frying. Cook using healthy oils, such as olive, canola, or sunflower oil. Avoid cooking with butter, cream, or high-fat meats. Meal planning Eat meals and snacks regularly, preferably at the same times every day. Avoid going long periods of time without eating. Eat foods that are high in fiber, such as fresh fruits, vegetables, beans, and whole grains. Eat 4-6 oz (112-168 g) of lean protein each day, such as lean meat, chicken, fish, eggs, or tofu. One ounce (oz) (28 g) of lean protein is equal to: 1 oz (28 g) of meat, chicken, or fish. 1 egg.  cup (62 g) of  tofu. Eat some foods each day that contain healthy fats, such as avocado, nuts, seeds, and fish. What foods should I eat? Fruits Berries. Apples. Oranges. Peaches. Apricots. Plums. Grapes. Mangoes. Papayas. Pomegranates. Kiwi. Cherries. Vegetables Leafy greens, including lettuce, spinach, kale, chard, collard greens, mustard greens, and cabbage. Beets. Cauliflower. Broccoli. Carrots. Green beans. Tomatoes. Peppers. Onions. Cucumbers. Brussels sprouts. Grains Whole grains, such as whole-wheat or whole-grain bread, crackers, tortillas, cereal, and pasta. Unsweetened oatmeal. Quinoa. Brown or wild rice. Meats and other proteins Seafood. Poultry without skin. Lean cuts of poultry and beef. Tofu. Nuts. Seeds. Dairy Low-fat or fat-free dairy products such as milk, yogurt, and cheese. The items listed above may not be a complete list of foods and beverages you can eat and drink. Contact a dietitian for more information. What foods should I avoid? Fruits Fruits canned with syrup. Vegetables Canned vegetables. Frozen vegetables with butter or cream sauce. Grains Refined  white flour and flour products such as bread, pasta, snack foods, and cereals. Avoid all processed foods. Meats and other proteins Fatty cuts of meat. Poultry with skin. Breaded or fried meats. Processed meat. Avoid saturated fats. Dairy Full-fat yogurt, cheese, or milk. Beverages Sweetened drinks, such as soda or iced tea. The items listed above may not be a complete list of foods and beverages you should avoid. Contact a dietitian for more information. Questions to ask a health care provider Do I need to meet with a certified diabetes care and education specialist? Do I need to meet with a dietitian? What number can I call if I have questions? When are the best times to check my blood glucose? Where to find more information: American Diabetes Association: diabetes.org Academy of Nutrition and Dietetics:  eatright.[employer] of Diabetes and Digestive and Kidney Diseases: StageSync.si Association of Diabetes Care & Education Specialists: diabeteseducator.org Summary It is important to have healthy eating habits because your blood sugar (glucose) levels are greatly affected by what you eat and drink. It is important to use alcohol carefully. A healthy meal plan will help you manage your blood glucose and lower your risk of heart disease. Your health care provider may recommend that you work with a dietitian to make a meal plan that is best for you. This information is not intended to replace advice given to you by your health care provider. Make sure you discuss any questions you have with your health care provider. Document Revised: 12/30/2019 Document Reviewed: 12/30/2019 Elsevier Patient Education  2024 Elsevier Inc.    Please schedule a Follow-up Appointment to: Return in about 6 months (around 09/04/2023) for 6 month DM A1c, 1st AM apt.  If you have any other questions or concerns, please feel free to call the office or send a message through MyChart. You may also schedule an earlier appointment if necessary.  Additionally, you may be receiving a survey about your experience at our office within a few days to 1 week by e-mail or mail. We value your feedback.  Saralyn Pilar, DO [facility], [place]

## 2023-03-11 ENCOUNTER — Encounter: Payer: Medicare Other | Admitting: Family Medicine

## 2023-04-02 ENCOUNTER — Ambulatory Visit: Payer: Medicare Other | Admitting: Family Medicine

## 2023-04-26 ENCOUNTER — Other Ambulatory Visit: Payer: Self-pay | Admitting: Internal Medicine

## 2023-04-26 ENCOUNTER — Other Ambulatory Visit: Payer: Self-pay | Admitting: Family Medicine

## 2023-04-26 DIAGNOSIS — I10 Essential (primary) hypertension: Secondary | ICD-10-CM

## 2023-04-26 DIAGNOSIS — K219 Gastro-esophageal reflux disease without esophagitis: Secondary | ICD-10-CM

## 2023-04-26 NOTE — Telephone Encounter (Signed)
Request is too soon, last refill 08/31/22 for 90 and 3 refills.  Requested Prescriptions  Pending Prescriptions Disp Refills   amLODipine (NORVASC) 10 MG tablet [Pharmacy Med Name: AMLODIPINE BESYLATE 10 MG TAB] 90 tablet 3    Sig: TAKE 1 TABLET BY MOUTH EVERY DAY     Cardiovascular: Calcium Channel Blockers 2 Passed - 04/26/2023  2:46 AM      Passed - Last BP in normal range    BP Readings from Last 1 Encounters:  03/07/23 136/82         Passed - Last Heart Rate in normal range    Pulse Readings from Last 1 Encounters:  03/07/23 67         Passed - Valid encounter within last 6 months    Recent Outpatient Visits           1 month ago Annual physical exam   [place] [facility] [place], Netta Neat, DO   4 months ago Pre-diabetes   [place] [facility] Smitty Cords, DO   7 months ago Pre-diabetes   [place] [facility] Althea Charon, Netta Neat, DO   1 year ago Annual physical exam   [place] [facility] Smitty Cords, DO   1 year ago Essential hypertension    [facility] Smitty Cords, DO       Future Appointments             In 3 months Deirdre Evener, MD [facility]

## 2023-04-26 NOTE — Telephone Encounter (Signed)
Requested Prescriptions  Pending Prescriptions Disp Refills   pantoprazole (PROTONIX) 20 MG tablet [Pharmacy Med Name: PANTOPRAZOLE SOD DR 20 MG TAB] 90 tablet 0    Sig: TAKE 1 TABLET BY MOUTH EVERY DAY BEFORE BREAKFAST     Gastroenterology: Proton Pump Inhibitors Passed - 04/26/2023  2:47 AM      Passed - Valid encounter within last 12 months    Recent Outpatient Visits           1 month ago Annual physical exam   [place] [facility] [place], Netta Neat, DO   4 months ago Pre-diabetes   [place] [facility] Smitty Cords, DO   7 months ago Pre-diabetes   [place] [facility] Smitty Cords, DO   1 year ago Annual physical exam   [place] [facility] Smitty Cords, DO   1 year ago Essential hypertension   [place] [facility] Smitty Cords, DO       Future Appointments             In 3 months Deirdre Evener, MD [facility]

## 2023-05-10 ENCOUNTER — Other Ambulatory Visit: Payer: Self-pay | Admitting: Dermatology

## 2023-05-10 DIAGNOSIS — C4441 Basal cell carcinoma of skin of scalp and neck: Secondary | ICD-10-CM

## 2023-05-13 ENCOUNTER — Other Ambulatory Visit: Payer: Self-pay | Admitting: Family Medicine

## 2023-05-13 DIAGNOSIS — I10 Essential (primary) hypertension: Secondary | ICD-10-CM

## 2023-05-13 NOTE — Telephone Encounter (Signed)
Medication Refill -  Most Recent Primary Care Visit:  Provider: Smitty Cords  Department: [facility]  Visit Type: PHYSICAL 20  Date: 03/07/2023  Medication: amLODipine (NORVASC) 10 MG tablet   Has the patient contacted their pharmacy? Yes Pharmacy advised pt to reach out to his PCP.   Is this the correct pharmacy for this prescription? Yes If no, delete pharmacy and type the correct one.  This is the patient's preferred pharmacy:  [facility] [address] [place] - [address] [place] [address] Phone: [phone] Fax: [phone]    Has the prescription been filled recently? No  Is the patient out of the medication? No. Pt states he only has two pills left.   Has the patient been seen for an appointment in the last year OR does the patient have an upcoming appointment? Yes  Can we respond through MyChart? No  Agent: Please be advised that Rx refills may take up to 3 business days. We ask that you follow-up with your pharmacy.

## 2023-05-16 NOTE — Telephone Encounter (Signed)
Called pharmacy - pt has refills available . Pharmacy will prepare refill. 

## 2023-05-16 NOTE — Telephone Encounter (Signed)
Requested Prescriptions  Refused Prescriptions Disp Refills   amLODipine (NORVASC) 10 MG tablet 90 tablet 3    Sig: Take 1 tablet (10 mg total) by mouth daily.     Cardiovascular: Calcium Channel Blockers 2 Passed - 05/13/2023  2:35 PM      Passed - Last BP in normal range    BP Readings from Last 1 Encounters:  03/07/23 136/82         Passed - Last Heart Rate in normal range    Pulse Readings from Last 1 Encounters:  03/07/23 67         Passed - Valid encounter within last 6 months    Recent Outpatient Visits           2 months ago Annual physical exam   [place] [facility] [place], Netta Neat, DO   5 months ago Pre-diabetes   [place] [facility] Smitty Cords, DO   8 months ago Pre-diabetes   [facility] [facility] Smitty Cords, DO   1 year ago Annual physical exam   [place] [facility] Smitty Cords, DO   1 year ago Essential hypertension    [facility] Smitty Cords, DO       Future Appointments             In 3 months Deirdre Evener, MD [facility]

## 2023-05-22 ENCOUNTER — Other Ambulatory Visit: Payer: Self-pay | Admitting: Family Medicine

## 2023-05-22 DIAGNOSIS — E559 Vitamin D deficiency, unspecified: Secondary | ICD-10-CM

## 2023-05-23 NOTE — Telephone Encounter (Signed)
Requested medication (s) are due for refill today:   No  Per note pt to start OTC D3 2000 international units at the completion of this rx   Requested medication (s) are on the active medication list:   Yes  Future visit scheduled:   Yes for AWV in June    Last ordered: 03/07/2023 #12, 0 refills  Non delegated refill.      Requested Prescriptions  Pending Prescriptions Disp Refills   Vitamin D, Ergocalciferol, (DRISDOL) 1.25 MG (50000 UNIT) CAPS capsule [Pharmacy Med Name: VITAMIN D2 1.25MG (50,000 UNIT)] 12 capsule 0    Sig: Take 1 capsule (50,000 Units total) by mouth once a week. For 12 weeks. Then start OTC Vitamin D3 2,000 unit daily.     Endocrinology:  Vitamins - Vitamin D Supplementation 2 Failed - 05/23/2023  9:23 AM      Failed - Manual Review: Route requests for 50,000 IU strength to the provider      Failed - Vitamin D in normal range and within 360 days    Vit D, 25-Hydroxy  Date Value Ref Range Status  03/04/2023 18 (L) 30 - 100 ng/mL Final    Comment:    Vitamin D Status         25-OH Vitamin D: . Deficiency:                    <20 ng/mL Insufficiency:             20 - 29 ng/mL Optimal:                 > or = 30 ng/mL . For 25-OH Vitamin D testing on patients on  D2-supplementation and patients for whom quantitation  of D2 and D3 fractions is required, the QuestAssureD(TM) 25-OH VIT D, (D2,D3), LC/MS/MS is recommended: order  code [address] (patients >7yrs). . See Note 1 . Note 1 . For additional information, please refer to  http://education.QuestDiagnostics.com/faq/FAQ199  (This link is being provided for informational/ educational purposes only.)          Passed - Ca in normal range and within 360 days    Calcium  Date Value Ref Range Status  03/04/2023 9.8 8.6 - 10.3 mg/dL Final   Calcium, Total  Date Value Ref Range Status  01/09/2014 8.9 8.5 - 10.1 mg/dL Final         Passed - Valid encounter within last 12 months    Recent Outpatient Visits            2 months ago Annual physical exam   [place] [facility] [place], Netta Neat, DO   5 months ago Pre-diabetes   [facility] Smitty Cords, DO   8 months ago Pre-diabetes   [place] [facility] Smitty Cords, DO   1 year ago Annual physical exam   [place] [facility] Smitty Cords, DO   1 year ago Essential hypertension   [place] [facility] Smitty Cords, DO       Future Appointments             In 3 months Deirdre Evener, MD [facility]

## 2023-05-23 NOTE — Telephone Encounter (Signed)
Please notify patient to pick up OTC version Vitamin D3 2,000 unit daily tab or capsule.  We typically do not renew the 50,000 unit dose unless lab is still low next time  Saralyn Pilar, DO [facility] 05/23/2023, 1:37 PM

## 2023-05-23 NOTE — Telephone Encounter (Signed)
Tried calling patient. No answer. Mailbox full.

## 2023-05-24 ENCOUNTER — Ambulatory Visit: Payer: Self-pay | Admitting: *Deleted

## 2023-05-24 NOTE — Telephone Encounter (Signed)
Pt given message per notes of Dr. Althea Charon from 05/22/23 on 05/24/23. Pt verbalized understanding message : Please notify patient to pick up OTC version Vitamin D3 2,000 unit daily tab or capsule.   We typically do not renew the 50,000 unit dose unless lab is still low next time   Saralyn Pilar, DO [facility] 05/23/2023, 1:37 PM

## 2023-08-05 ENCOUNTER — Ambulatory Visit (INDEPENDENT_AMBULATORY_CARE_PROVIDER_SITE_OTHER): Payer: Medicare Other | Admitting: Family Medicine

## 2023-08-05 ENCOUNTER — Other Ambulatory Visit: Payer: Self-pay | Admitting: Family Medicine

## 2023-08-05 ENCOUNTER — Encounter: Payer: Self-pay | Admitting: Family Medicine

## 2023-08-05 VITALS — BP 130/80 | HR 73 | Ht 68.5 in | Wt 197.0 lb

## 2023-08-05 DIAGNOSIS — I10 Essential (primary) hypertension: Secondary | ICD-10-CM

## 2023-08-05 DIAGNOSIS — E7800 Pure hypercholesterolemia, unspecified: Secondary | ICD-10-CM

## 2023-08-05 DIAGNOSIS — K219 Gastro-esophageal reflux disease without esophagitis: Secondary | ICD-10-CM | POA: Diagnosis not present

## 2023-08-05 DIAGNOSIS — R7303 Prediabetes: Secondary | ICD-10-CM

## 2023-08-05 DIAGNOSIS — Z0000 Encounter for general adult medical examination without abnormal findings: Secondary | ICD-10-CM

## 2023-08-05 DIAGNOSIS — E559 Vitamin D deficiency, unspecified: Secondary | ICD-10-CM

## 2023-08-05 DIAGNOSIS — Z8546 Personal history of malignant neoplasm of prostate: Secondary | ICD-10-CM

## 2023-08-05 LAB — POCT GLYCOSYLATED HEMOGLOBIN (HGB A1C): Hemoglobin A1C: 6.4 % — AB (ref 4.0–5.6)

## 2023-08-05 MED ORDER — AMLODIPINE BESYLATE 10 MG PO TABS: 10.0000 mg | ORAL_TABLET | Freq: Every day | ORAL | 3 refills | Status: AC

## 2023-08-05 MED ORDER — PANTOPRAZOLE SODIUM 20 MG PO TBEC: 20.0000 mg | DELAYED_RELEASE_TABLET | Freq: Every day | ORAL | 3 refills | Status: AC

## 2023-08-05 MED ORDER — ROSUVASTATIN CALCIUM 10 MG PO TABS: 10.0000 mg | ORAL_TABLET | ORAL | 3 refills | Status: AC

## 2023-08-05 NOTE — Progress Notes (Signed)
 Subjective:    Patient ID: Justin Mckee, male    DOB: 30-Apr-1956, 68 y.o.   MRN: [id number]  Trayson Stitely is a 68 y.o. male presenting on 08/05/2023 for Pre-Diabetes and Hypertension   HPI  Discussed the use of AI scribe software for clinical note transcription with the patient, who gave verbal consent to proceed.  History of Present Illness   Justin Mckee is a 68 year old male who presents for follow-up on blood sugar management.  Chronic Hypertension Checking BP at home avg 130/80s, doing well, no concerns. Meds - Amlodipine 10mg  daily Denies Headaches, CP, dyspnea, edema, dizziness / lightheadedness, numbness weakness, hearing loss   HYPERLIPIDEMIA: - Reports no concerns. Last lipid panel 02/2023, improved on Statin however had symptoms was switched to intermittent - Currently taking Rosuvastatin 10mg  2-3 times per week or every other day to avoid myalgia   Pre-Diabetes A1c improved to 6.4 now Previous higher range 6.6 to 6.7 now, he has fam history of Diabetes Family history of Diabetes Improving diet, limiting soda now, only drinking no sugar added soda, and reduced or stopped eating biscuits Meds: never on meds Lifestyle - Exercise physically active at work and doing housework Denies hypoglycemia, polyuria       03/07/2023    8:13 AM 12/05/2022    8:20 AM 11/23/2022    3:03 PM  Depression screen PHQ 2/9  Decreased Interest 0 0 0  Down, Depressed, Hopeless 0 0 0  PHQ - 2 Score 0 0 0  Altered sleeping 0 1 0  Tired, decreased energy 0 0 0  Change in appetite 0 0 0  Feeling bad or failure about yourself  0 0 0  Trouble concentrating 0 0 0  Moving slowly or fidgety/restless 0 0 0  Suicidal thoughts 0 0 0  PHQ-9 Score 0 1 0  Difficult doing work/chores  Not difficult at all Not difficult at all       03/07/2023    8:14 AM 12/05/2022    8:21 AM 08/31/2022    8:11 AM 08/22/2021    9:42 AM  GAD 7 : Generalized Anxiety Score  Nervous, Anxious, on Edge 0 0 0 0   Control/stop worrying 0 0 0 0  Worry too much - different things 0 0 0 0  Trouble relaxing 0 0 0 0  Restless 0 0 0 0  Easily annoyed or irritable 0 0 0 0  Afraid - awful might happen 0 0 0 0  Total GAD 7 Score 0 0 0 0  Anxiety Difficulty  Not difficult at all Not difficult at all Not difficult at all    Social History   Tobacco Use   Smoking status: Former    Current packs/day: 0.00    Average packs/day: 1 pack/day for 40.0 years (40.0 ttl pk-yrs)    Types: Cigarettes    Start date: 06/01/1976    Quit date: 06/01/2016    Years since quitting: 7.1   Smokeless tobacco: Former    Quit date: 08/28/2015  Vaping Use   Vaping status: Never Used  Substance Use Topics   Alcohol use: Yes    Alcohol/week: 12.0 standard drinks of alcohol    Types: 12 Cans of beer per week    Comment: weekly   Drug use: No    Review of Systems Per HPI unless specifically indicated above     Objective:    BP 130/80   Pulse 73   Ht 5' 8.5" (1.74 m)  Wt 197 lb (89.4 kg)   SpO2 98%   BMI 29.52 kg/m   Wt Readings from Last 3 Encounters:  08/05/23 197 lb (89.4 kg)  03/07/23 196 lb (88.9 kg)  12/05/22 195 lb 12.8 oz (88.8 kg)    Physical Exam Vitals and nursing note reviewed.  Constitutional:      General: He is not in acute distress.    Appearance: He is well-developed. He is not diaphoretic.     Comments: Well-appearing, comfortable, cooperative  HENT:     Head: Normocephalic and atraumatic.  Eyes:     General:        Right eye: No discharge.        Left eye: No discharge.     Conjunctiva/sclera: Conjunctivae normal.  Neck:     Thyroid: No thyromegaly.  Cardiovascular:     Rate and Rhythm: Normal rate and regular rhythm.     Pulses: Normal pulses.     Heart sounds: Normal heart sounds. No murmur heard. Pulmonary:     Effort: Pulmonary effort is normal. No respiratory distress.     Breath sounds: Normal breath sounds. No wheezing or rales.  Musculoskeletal:        General:  Normal range of motion.     Cervical back: Normal range of motion and neck supple.  Lymphadenopathy:     Cervical: No cervical adenopathy.  Skin:    General: Skin is warm and dry.     Findings: No erythema or rash.  Neurological:     Mental Status: He is alert and oriented to person, place, and time. Mental status is at baseline.  Psychiatric:        Behavior: Behavior normal.     Comments: Well groomed, good eye contact, normal speech and thoughts     Results for orders placed or performed in visit on 08/05/23  POCT HgB A1C   Collection Time: 08/05/23 11:05 AM  Result Value Ref Range   Hemoglobin A1C 6.4 (A) 4.0 - 5.6 %   HbA1c POC (<> result, manual entry)     HbA1c, POC (prediabetic range)     HbA1c, POC (controlled diabetic range)        Assessment & Plan:   Problem List Items Addressed This Visit     Essential hypertension   Relevant Medications   amLODipine (NORVASC) 10 MG tablet   rosuvastatin (CRESTOR) 10 MG tablet   Pre-diabetes - Primary   Relevant Orders   POCT HgB A1C (Completed)   Pure hypercholesterolemia   Relevant Medications   amLODipine (NORVASC) 10 MG tablet   rosuvastatin (CRESTOR) 10 MG tablet   Other Visit Diagnoses       Gastro-esophageal reflux disease without esophagitis       Relevant Medications   pantoprazole (PROTONIX) 20 MG tablet        Prediabetes Improved HbA1c from 6.6 to 6.4 through dietary changes, specifically eliminating gravy biscuits and sodas. No medications required at this time. -Continue current dietary modifications. -Next HbA1c check in 7 months (September 2025).  Hypertension Well controlled with current medication. BP today 130/80. -Refill antihypertensive medication.  Hyperlipidemia Managed with intermittent cholesterol medication. -Refill cholesterol medication. Rosavustatin 10mg  every other day  Gastroesophageal Reflux Disease (GERD) No current complaints. -Refill acid reflux medication. Pantoprazole  20mg  daily  General Health Maintenance -Next lung scan due in summer 2025 for surveillance of previously identified nodules. -Schedule follow-up appointment for September 2025.         Orders Placed This  Encounter  Procedures   POCT HgB A1C    Meds ordered this encounter  Medications   amLODipine (NORVASC) 10 MG tablet    Sig: Take 1 tablet (10 mg total) by mouth daily.    Dispense:  90 tablet    Refill:  3    Add refills for future   rosuvastatin (CRESTOR) 10 MG tablet    Sig: Take 1 tablet (10 mg total) by mouth every other day.    Dispense:  45 tablet    Refill:  3    Add future refills   pantoprazole (PROTONIX) 20 MG tablet    Sig: Take 1 tablet (20 mg total) by mouth daily before breakfast.    Dispense:  90 tablet    Refill:  3    Follow up plan: Return in about 7 months (around 03/04/2024) for 7 month fasting lab > 1 week later Annual Physical.  Future labs ordered for 03/09/24    Saralyn Pilar, DO [facility] 08/05/2023, 11:13 AM

## 2023-08-05 NOTE — Patient Instructions (Addendum)
 Thank you for coming to the office today.  Recent Labs    12/05/22 0838 03/04/23 0813 08/05/23 1105  HGBA1C 6.6* 6.7* 6.4*   [facility]  DUE for FASTING BLOOD WORK (no food or drink after midnight before the lab appointment, only water or coffee without cream/sugar on the morning of)  SCHEDULE "Lab Only" visit in the morning at the clinic for lab draw in 7 MONTHS   - Make sure Lab Only appointment is at about 1 week before your next appointment, so that results will be available  For Lab Results, once available within 2-3 days of blood draw, you can can log in to MyChart online to view your results and a brief explanation. Also, we can discuss results at next follow-up visit.   Please schedule a Follow-up Appointment to: Return in about 7 months (around 03/04/2024) for 7 month fasting lab > 1 week later Annual Physical.  If you have any other questions or concerns, please feel free to call the office or send a message through MyChart. You may also schedule an earlier appointment if necessary.  Additionally, you may be receiving a survey about your experience at our office within a few days to 1 week by e-mail or mail. We value your feedback.  Saralyn Pilar, DO [facility] [place]

## 2023-08-21 ENCOUNTER — Ambulatory Visit: Payer: Medicare Other | Admitting: Dermatology

## 2023-09-23 ENCOUNTER — Encounter: Payer: Self-pay | Admitting: Dermatology

## 2023-09-23 ENCOUNTER — Ambulatory Visit: Admitting: Dermatology

## 2023-09-23 DIAGNOSIS — L819 Disorder of pigmentation, unspecified: Secondary | ICD-10-CM

## 2023-09-23 DIAGNOSIS — Z7189 Other specified counseling: Secondary | ICD-10-CM

## 2023-09-23 DIAGNOSIS — Z85828 Personal history of other malignant neoplasm of skin: Secondary | ICD-10-CM | POA: Diagnosis not present

## 2023-09-23 DIAGNOSIS — D229 Melanocytic nevi, unspecified: Secondary | ICD-10-CM

## 2023-09-23 DIAGNOSIS — L905 Scar conditions and fibrosis of skin: Secondary | ICD-10-CM | POA: Diagnosis not present

## 2023-09-23 NOTE — Patient Instructions (Signed)

## 2023-09-23 NOTE — Progress Notes (Signed)
   Follow-Up Visit   Subjective  Justin Mckee is a 68 y.o. 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  Objective  Well appearing patient in no apparent distress; mood and affect are within normal limits.  A focused examination was performed of the following areas: that face and   Relevant exam findings are noted in the Assessment and Plan.   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   Benign-appearing.  Observation.  Call clinic for new or changing lesions. Recommend daily broad spectrum sunscreen SPF 30+, reapply every 2 hours as needed. Treatment: Recommend Serica moisturizing scar formula cream every night or [facility] brand or Mederma silicone scar sheet every night for the first year after a scar appears to help with scar remodeling if desired. Scars remodel on their own for a full year and will gradually improve in appearance over time.  If papule at inf grows or changes patient will RTC before his next scheduled appointment.   Treatment Plan: Clear. Observe for recurrence. Call clinic for new or changing lesions.  Recommend  regular skin exams, daily broad-spectrum spf 30+ sunscreen use, and photoprotection.       Return in about 9 months (around 06/24/2024) for recheck [facility] site.  Arlinda Lais, CMA, am acting as scribe for Celine Collard, MD .   Documentation: I have reviewed the above documentation for accuracy and completeness, and I agree with the above.  Celine Collard, MD

## 2023-11-29 ENCOUNTER — Ambulatory Visit: Payer: Medicare Other

## 2023-11-29 DIAGNOSIS — Z0000 Encounter for general adult medical examination without abnormal findings: Secondary | ICD-10-CM

## 2023-11-29 NOTE — Patient Instructions (Addendum)
 Mr. Dorer , Thank you for taking time out of your busy schedule to complete your Annual Wellness Visit with me. I enjoyed our conversation and look forward to speaking with you again next year. I, as well as your care team,  appreciate your ongoing commitment to your health goals. Please review the following plan we discussed and let me know if I can assist you in the future.   Follow up Visits: Next Medicare AWV with our clinical staff:   12/04/24 @ 11:30 AM BY PHONE Have you seen your provider in the last 6 months (3 months if uncontrolled diabetes)? Yes  Clinician Recommendations:  Aim for 30 minutes of exercise or brisk walking, 6-8 glasses of water , and 5 servings of fruits and vegetables each day. TAKE CARE!      This is a list of the screening recommended for you and due dates:  Health Maintenance  Topic Date Due   COVID-19 Vaccine (5 - 2024-25 season) 02/10/2023   Screening for Lung Cancer  12/10/2023   Flu Shot  01/10/2024   Medicare Annual Wellness Visit  11/28/2024   Colon Cancer Screening  04/11/2031   Pneumococcal Vaccine for age over 76  Completed   Hepatitis C Screening  Completed   Zoster (Shingles) Vaccine  Completed   HPV Vaccine  Aged Out   Meningitis B Vaccine  Aged Out   DTaP/Tdap/Td vaccine  Discontinued    Advanced directives: (ACP Link)Information on Advanced Care Planning can be found at [place]  Secretary of [facility] Advance Health Care Directives Advance Health Care Directives. [email or web address]  Advance Care Planning is important because it:  [x]  Makes sure you receive the medical care that is consistent with your values, goals, and preferences  [x]  It provides guidance to your family and loved ones and reduces their decisional burden about whether or not they are making the right decisions based on your wishes.  Follow the link provided in your after visit summary or read over the paperwork we have mailed to you to help you started getting your Advance  Directives in place. If you need assistance in completing these, please reach out to us  so that we can help you!
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
## 2024-03-09 ENCOUNTER — Other Ambulatory Visit: Payer: Medicare Other

## 2024-03-09 DIAGNOSIS — E7800 Pure hypercholesterolemia, unspecified: Secondary | ICD-10-CM | POA: Diagnosis not present

## 2024-03-09 DIAGNOSIS — E559 Vitamin D deficiency, unspecified: Secondary | ICD-10-CM | POA: Diagnosis not present

## 2024-03-09 DIAGNOSIS — R7303 Prediabetes: Secondary | ICD-10-CM

## 2024-03-09 DIAGNOSIS — I10 Essential (primary) hypertension: Secondary | ICD-10-CM | POA: Diagnosis not present

## 2024-03-09 DIAGNOSIS — Z0000 Encounter for general adult medical examination without abnormal findings: Secondary | ICD-10-CM

## 2024-03-09 DIAGNOSIS — Z8546 Personal history of malignant neoplasm of prostate: Secondary | ICD-10-CM

## 2024-03-10 LAB — CBC WITH DIFFERENTIAL/PLATELET
Absolute Lymphocytes: 2136 {cells}/uL (ref 850–3900)
Absolute Monocytes: 714 {cells}/uL (ref 200–950)
Basophils Absolute: 42 {cells}/uL (ref 0–200)
Basophils Relative: 0.7 %
Eosinophils Absolute: 108 {cells}/uL (ref 15–500)
Eosinophils Relative: 1.8 %
HCT: 40.3 % (ref 38.5–50.0)
Hemoglobin: 13 g/dL — ABNORMAL LOW (ref 13.2–17.1)
MCH: 30 pg (ref 27.0–33.0)
MCHC: 32.3 g/dL (ref 32.0–36.0)
MCV: 92.9 fL (ref 80.0–100.0)
MPV: 10 fL (ref 7.5–12.5)
Monocytes Relative: 11.9 %
Neutro Abs: 3000 {cells}/uL (ref 1500–7800)
Neutrophils Relative %: 50 %
Platelets: 356 Thousand/uL (ref 140–400)
RBC: 4.34 Million/uL (ref 4.20–5.80)
RDW: 14.2 % (ref 11.0–15.0)
Total Lymphocyte: 35.6 %
WBC: 6 Thousand/uL (ref 3.8–10.8)

## 2024-03-10 LAB — COMPLETE METABOLIC PANEL WITHOUT GFR
AG Ratio: 1.5 (calc) (ref 1.0–2.5)
ALT: 16 U/L (ref 9–46)
AST: 13 U/L (ref 10–35)
Albumin: 4.1 g/dL (ref 3.6–5.1)
Alkaline phosphatase (APISO): 33 U/L — ABNORMAL LOW (ref 35–144)
BUN: 19 mg/dL (ref 7–25)
CO2: 26 mmol/L (ref 20–32)
Calcium: 8.9 mg/dL (ref 8.6–10.3)
Chloride: 105 mmol/L (ref 98–110)
Creat: 1 mg/dL (ref 0.70–1.35)
Globulin: 2.8 g/dL (ref 1.9–3.7)
Glucose, Bld: 115 mg/dL — ABNORMAL HIGH (ref 65–99)
Potassium: 4.5 mmol/L (ref 3.5–5.3)
Sodium: 137 mmol/L (ref 135–146)
Total Bilirubin: 0.5 mg/dL (ref 0.2–1.2)
Total Protein: 6.9 g/dL (ref 6.1–8.1)

## 2024-03-10 LAB — LIPID PANEL
Cholesterol: 140 mg/dL (ref <200)
HDL: 43 mg/dL (ref >=40)
LDL Cholesterol (Calc): 80 mg/dL
Non-HDL Cholesterol (Calc): 97 mg/dL (ref <130)
Total CHOL/HDL Ratio: 3.3 (calc) (ref <5.0)
Triglycerides: 83 mg/dL (ref <150)

## 2024-03-10 LAB — PSA: PSA: 0.21 ng/mL (ref <=4.00)

## 2024-03-10 LAB — VITAMIN D 25 HYDROXY (VIT D DEFICIENCY, FRACTURES): Vit D, 25-Hydroxy: 29 ng/mL — ABNORMAL LOW (ref 30–100)

## 2024-03-10 LAB — HEMOGLOBIN A1C
Hgb A1c MFr Bld: 6.4 % — ABNORMAL HIGH (ref <5.7)
Mean Plasma Glucose: 137 mg/dL
eAG (mmol/L): 7.6 mmol/L

## 2024-03-16 ENCOUNTER — Ambulatory Visit (INDEPENDENT_AMBULATORY_CARE_PROVIDER_SITE_OTHER): Payer: Medicare Other | Admitting: Family Medicine

## 2024-03-16 ENCOUNTER — Encounter: Payer: Self-pay | Admitting: Family Medicine

## 2024-03-16 VITALS — BP 134/70 | HR 71 | Ht 68.5 in | Wt 191.5 lb

## 2024-03-16 DIAGNOSIS — E559 Vitamin D deficiency, unspecified: Secondary | ICD-10-CM | POA: Diagnosis not present

## 2024-03-16 DIAGNOSIS — R7303 Prediabetes: Secondary | ICD-10-CM | POA: Diagnosis not present

## 2024-03-16 DIAGNOSIS — I700 Atherosclerosis of aorta: Secondary | ICD-10-CM | POA: Diagnosis not present

## 2024-03-16 DIAGNOSIS — E7800 Pure hypercholesterolemia, unspecified: Secondary | ICD-10-CM | POA: Diagnosis not present

## 2024-03-16 DIAGNOSIS — I10 Essential (primary) hypertension: Secondary | ICD-10-CM

## 2024-03-16 DIAGNOSIS — Z0000 Encounter for general adult medical examination without abnormal findings: Secondary | ICD-10-CM

## 2024-03-16 DIAGNOSIS — Z8546 Personal history of malignant neoplasm of prostate: Secondary | ICD-10-CM

## 2024-03-16 DIAGNOSIS — Z23 Encounter for immunization: Secondary | ICD-10-CM | POA: Diagnosis not present

## 2024-03-16 NOTE — Patient Instructions (Addendum)
 Thank you for coming to the office today.  Recent Labs    08/05/23 1105 03/09/24 0759  HGBA1C 6.4* 6.4*    You have been referred for a Coronary Calcium  Score Cardiac CT Scan. This is a screening test for patients aged 68-50+ with cardiovascular risk factors or who are healthy but would be interested in Cardiovascular Screening for heart disease. Even if there is a family history of heart disease, this imaging can be useful. Typically it can be done every 5+ years or at a different timeline we agree on  The scan will look at the chest and mainly focus on the heart and identify early signs of calcium  build up or blockages within the heart arteries. It is not 100% accurate for identifying blockages or heart disease, but it is useful to help us  predict who may have some early changes or be at risk in the future for a heart attack or cardiovascular problem.  The results are reviewed by a Cardiologist and they will document the results. It should become available on MyChart. Typically the results are divided into percentiles based on other patients of the same demographic and age. So it will compare your risk to others similar to you. If you have a higher score >99 or higher percentile >75%tile, it is recommended to consider Statin cholesterol therapy and or referral to Cardiologist. I will try to help explain your results and if we have questions we can contact the Cardiologist.  You will be contacted for scheduling. Usually it is done at any imaging facility through [facility], [facility] or [facility].  The cost is $99 flat fee total and it does not go through insurance, so no authorization is required.    Please schedule a Follow-up Appointment to: Return for 6 month PreDM A1c.  If you have any other questions or concerns, please feel free to call the office or send a message through MyChart. You may also schedule an earlier appointment if  necessary.  Additionally, you may be receiving a survey about your experience at our office within a few days to 1 week by e-mail or mail. We value your feedback.  Marsa Officer, DO [facility], [place]
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
## 2024-06-16 ENCOUNTER — Encounter: Payer: Self-pay | Admitting: Dermatology

## 2024-06-16 ENCOUNTER — Ambulatory Visit: Admitting: Dermatology

## 2024-06-16 DIAGNOSIS — Z79899 Other long term (current) drug therapy: Secondary | ICD-10-CM

## 2024-06-16 DIAGNOSIS — D229 Melanocytic nevi, unspecified: Secondary | ICD-10-CM | POA: Diagnosis not present

## 2024-06-16 DIAGNOSIS — Z1283 Encounter for screening for malignant neoplasm of skin: Secondary | ICD-10-CM

## 2024-06-16 DIAGNOSIS — L821 Other seborrheic keratosis: Secondary | ICD-10-CM | POA: Diagnosis not present

## 2024-06-16 DIAGNOSIS — B360 Pityriasis versicolor: Secondary | ICD-10-CM | POA: Diagnosis not present

## 2024-06-16 DIAGNOSIS — Z85828 Personal history of other malignant neoplasm of skin: Secondary | ICD-10-CM

## 2024-06-16 DIAGNOSIS — Z7189 Other specified counseling: Secondary | ICD-10-CM

## 2024-06-16 MED ORDER — KETOCONAZOLE 2 % EX SHAM: MEDICATED_SHAMPOO | CUTANEOUS | 11 refills | Status: AC

## 2024-06-16 NOTE — Patient Instructions (Signed)

## 2024-06-16 NOTE — Progress Notes (Signed)
" ° °  Follow-Up Visit   Subjective  Justin Mckee is a 69 y.o. male who presents for the following: 1 year f/u Nevus Sebaceous with Cancer / BCC transformation. The patient has spots, moles and lesions to be evaluated, some may be new or changing and the patient may have concern these could be cancer.   The patient presents for Upper Body Skin Exam (UBSE) for skin cancer screening and mole check.  The patient has spots, moles and lesions to be evaluated, some may be new or changing and the patient has concerns that these could be cancer.    The following portions of the chart were reviewed this encounter and updated as appropriate: medications, allergies, medical history  Review of Systems:  No other skin or systemic complaints except as noted in HPI or Assessment and Plan.  Objective  Well appearing patient in no apparent distress; mood and affect are within normal limits.   A focused examination was performed of the following areas: face,arms,chest, back    Relevant exam findings are noted in the Assessment and Plan.    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 Recommend observation. Call clinic for new or changing moles. Recommend daily use of broad spectrum spf 30+ sunscreen to sun-exposed areas.    SEBORRHEIC KERATOSIS - Stuck-on, waxy, tan-brown papules and/or plaques  - Benign-appearing - Discussed benign etiology and prognosis. - Observe - Call for any changes    Tinea Versicolor Back  Chronic and persistent condition with duration or  expected duration over one year. Condition is symptomatic/ bothersome to patient. Not currently at goal.   Use ketoconazole  shampoo as body wash 3 times a week.    Tinea versicolor is a chronic recurrent skin rash causing discolored scaly spots most commonly seen on back, chest, and/or shoulders.  It is generally asymptomatic. The rash is due to overgrowth of a common type of yeast present on everyone's skin and it is not contagious.  It tends to flare more in the summer due to increased sweating on trunk.  After rash is treated, the scaliness will resolve, but the discoloration will take longer to return to normal pigmentation. The periodic use of an OTC medicated soap/shampoo with zinc or selenium sulfide can be helpful to prevent yeast overgrowth and recurrence.   SKIN CANCER SCREENING   HISTORY OF BASAL CELL CARCINOMA   SEBORRHEIC KERATOSIS   TINEA VERSICOLOR   COUNSELING AND COORDINATION OF CARE   MEDICATION MANAGEMENT   MELANOCYTIC NEVUS, UNSPECIFIED LOCATION    Return in about 1 year (around 06/16/2025) for UBSE, hx of Nevus Sebaceous with Cancer / BCC transformation.  IFay Kirks, CMA, am acting as scribe for Alm Rhyme, MD .   Documentation: I have reviewed the above documentation for accuracy and completeness, and I agree with the above.  Alm Rhyme, MD    "

## 2024-06-30 ENCOUNTER — Ambulatory Visit
Admission: RE | Admit: 2024-06-30 | Discharge: 2024-06-30 | Disposition: A | Discharge status: Home / Self Care | Payer: Self-pay | Source: Ambulatory Visit | Attending: Family Medicine | Admitting: Family Medicine

## 2024-06-30 DIAGNOSIS — E7800 Pure hypercholesterolemia, unspecified: Secondary | ICD-10-CM | POA: Insufficient documentation

## 2024-07-03 ENCOUNTER — Ambulatory Visit: Payer: Self-pay | Admitting: Family Medicine

## 2024-09-14 ENCOUNTER — Ambulatory Visit: Admitting: Family Medicine

## 2024-12-04 ENCOUNTER — Ambulatory Visit

## 2024-12-09 ENCOUNTER — Ambulatory Visit

## 2025-06-16 ENCOUNTER — Ambulatory Visit: Admitting: Dermatology
